# Patient Record
Sex: Female | Born: 1985
Health system: Southern US, Community
[De-identification: ages and names within clinical notes are randomized; demographics above are authoritative.]

## PROBLEM LIST (undated history)

## (undated) HISTORY — PX: FRACTURE SURGERY: SHX138

## (undated) HISTORY — PX: TUBAL LIGATION: SHX77

---

## 2016-11-16 ENCOUNTER — Emergency Department (HOSPITAL_COMMUNITY)
Admission: EM | Admit: 2016-11-16 | Discharge: 2016-11-16 | Disposition: A | Discharge status: Home / Self Care | Payer: Medicaid Other | Attending: Emergency Medicine | Admitting: Emergency Medicine

## 2016-11-16 ENCOUNTER — Encounter (HOSPITAL_COMMUNITY): Payer: Self-pay | Admitting: Emergency Medicine

## 2016-11-16 DIAGNOSIS — Y939 Activity, unspecified: Secondary | ICD-10-CM | POA: Diagnosis not present

## 2016-11-16 DIAGNOSIS — F17210 Nicotine dependence, cigarettes, uncomplicated: Secondary | ICD-10-CM | POA: Insufficient documentation

## 2016-11-16 DIAGNOSIS — S40861A Insect bite (nonvenomous) of right upper arm, initial encounter: Secondary | ICD-10-CM | POA: Insufficient documentation

## 2016-11-16 DIAGNOSIS — Y929 Unspecified place or not applicable: Secondary | ICD-10-CM | POA: Insufficient documentation

## 2016-11-16 DIAGNOSIS — W57XXXA Bitten or stung by nonvenomous insect and other nonvenomous arthropods, initial encounter: Secondary | ICD-10-CM | POA: Insufficient documentation

## 2016-11-16 DIAGNOSIS — Y999 Unspecified external cause status: Secondary | ICD-10-CM | POA: Diagnosis not present

## 2016-11-16 DIAGNOSIS — L03113 Cellulitis of right upper limb: Secondary | ICD-10-CM | POA: Diagnosis not present

## 2016-11-16 MED ORDER — DIPHENHYDRAMINE HCL 25 MG PO CAPS
50.0000 mg | ORAL_CAPSULE | Freq: Once | ORAL | Status: AC
  Administered 2016-11-16: 50 mg via ORAL
  Filled 2016-11-16: qty 2

## 2016-11-16 MED ORDER — SULFAMETHOXAZOLE-TRIMETHOPRIM 800-160 MG PO TABS: 1.0000 | ORAL_TABLET | Freq: Two times a day (BID) | ORAL | 0 refills | Status: AC

## 2016-11-16 MED ORDER — IBUPROFEN 600 MG PO TABS: 600.0000 mg | ORAL_TABLET | Freq: Four times a day (QID) | ORAL | 0 refills | Status: DC | PRN

## 2016-11-16 NOTE — ED Triage Notes (Signed)
Patient noticed small bump posterior lateral right upper arm yesterday then this morning when she woke up noticed erythema, pain , heat and swelling to upper arm. Patient bit by something just unsure what it was.

## 2016-11-16 NOTE — ED Provider Notes (Signed)
[facility]ENC[facility] Provider Note   CSN: [id number] Arrival date & time: 11/16/16  1015     History   Chief Complaint Chief Complaint  Patient presents with  . Insect Bite    HPI Danielle Osborn is a 31 y.o. female presenting with pruritic, painful, swollen, erythematous, warm patch to the posterior right upper extremity. She explains that she felt an insect bite yesterday morning which created a small bump in that area and was pruritic. She didn't think too much of it initially but then it continued to grow and get larger and larger, warm, painful with motion. She reports that she was unable to see the area as it is behind her arm but someone slide and sent her from work to get evaluated. She denies any systemic symptoms, no fever, chills, nausea, vomiting. She is otherwise well. She reports that it may be more painful now than pruritic, but she has been scratching at all day. Denies facial swelling, difficulty breathing, chest pain or other symptoms.  HPI  History reviewed. No pertinent past medical history.  There are no active problems to display for this patient.   Past Surgical History:  Procedure Laterality Date  . FRACTURE SURGERY     right leg fracture   . TUBAL LIGATION      OB History    No data available       Home Medications    Prior to Admission medications   Medication Sig Start Date End Date Taking? Authorizing Provider  ibuprofen (ADVIL,MOTRIN) 600 MG tablet Take 1 tablet (600 mg total) by mouth every 6 (six) hours as needed. 11/16/16   Mathews Robinsons B, PA-C  sulfamethoxazole-trimethoprim (BACTRIM DS,SEPTRA DS) 800-160 MG tablet Take 1 tablet by mouth 2 (two) times daily. 11/16/16 11/23/16  Georgiana Shore, PA-C    Family History No family history on file.  Social History Social History  Substance Use Topics  . Smoking status: Current Every Day Smoker    Types: Cigarettes  . Smokeless tobacco: Never Used  . Alcohol use No      Allergies   Patient has no allergy information on record.   Review of Systems Review of Systems  Constitutional: Negative for chills, fatigue and fever.  HENT: Negative for facial swelling and trouble swallowing.   Eyes: Negative for pain, redness, itching and visual disturbance.  Respiratory: Negative for cough, choking, chest tightness, shortness of breath, wheezing and stridor.   Cardiovascular: Negative for chest pain and palpitations.  Gastrointestinal: Negative for abdominal pain, nausea and vomiting.  Genitourinary: Negative for dysuria and hematuria.  Musculoskeletal: Positive for myalgias. Negative for arthralgias, back pain, gait problem, joint swelling, neck pain and neck stiffness.  Skin: Positive for color change and wound. Negative for pallor and rash.  Neurological: Negative for dizziness, tremors, seizures, syncope, facial asymmetry, speech difficulty, weakness, light-headedness, numbness and headaches.     Physical Exam Updated Vital Signs BP (!) 134/94 (BP Location: Left Arm)   Pulse 60   Temp 98.7 F (37.1 C) (Oral)   Resp 16   Ht 5' (1.524 m)   Wt 104.3 kg (230 lb)   LMP 10/23/2016   SpO2 100%   BMI 44.92 kg/m   Physical Exam  Constitutional: She appears well-developed and well-nourished. No distress.  Afebrile, nontoxic-appearing, sitting comfortable in chair in no acute distress.  HENT:  Head: Normocephalic and atraumatic.  Eyes: Conjunctivae and EOM are normal.  Neck: Normal range of motion. Neck supple.  Cardiovascular: Normal rate, regular  rhythm, normal heart sounds and intact distal pulses.   No murmur heard. Pulmonary/Chest: Effort normal and breath sounds normal. No respiratory distress. She has no wheezes. She exhibits no tenderness.  Abdominal: She exhibits no distension.  Musculoskeletal: Normal range of motion. She exhibits edema and tenderness. She exhibits no deformity.  Neurological: She is alert. No sensory deficit. She  exhibits normal muscle tone.  Skin: Skin is warm and dry. No rash noted. She is not diaphoretic. There is erythema. No pallor.  10 x 10 cm edematous, erythematous, warm patch with ill-defined borders to the right posterior upper arm midway between the elbow and shoulder. Central area of induration, tender to palpation.  Psychiatric: She has a normal mood and affect.  Nursing note and vitals reviewed.    ED Treatments / Results  Labs (all labs ordered are listed, but only abnormal results are displayed) Labs Reviewed - No data to display  EKG  EKG Interpretation None       Radiology No results found.  Procedures Procedures (including critical care time) EMERGENCY DEPARTMENT US SOFT TISSUE INTERPRETATION "Study: Limited Soft Tissue Ultrasound"  INDICATIONS: Soft tissue infection Multiple views of the body part were obtained in real-time with a multi-frequency linear probe  PERFORMED BY: Myself IMAGES ARCHIVED?: Yes SIDE:Right  BODY PART:Upper extremity INTERPRETATION:  No abcess noted and Cellulitis present   Medications Ordered in ED Medications  diphenhydrAMINE (BENADRYL) capsule 50 mg (50 mg Oral Given 11/16/16 1353)     Initial Impression / Assessment and Plan / ED Course  I have reviewed the triage vital signs and the nursing notes.  Pertinent labs & imaging results that were available during my care of the patient were reviewed by me and considered in my medical decision making (see chart for details).     Patient presenting with insect bite which has been pruritic but is now painful with movement. Erythema, warmth to touch and induration. Non-circumferential.  Raising concern for cellulitis. Ultrasound with evidence of early cellulitis. The area was marked with a surgical pen.  Patient discharged with antibiotics and close follow-up with PCP in 48 hours for recheck. Patient instructed on monitoring for any worsening of condition.  She was otherwise  well-appearing, nontoxic afebrile. She improved while in the emergency department.  Discussed strict return precautions and advised to return to the emergency department if experiencing any new or worsening symptoms. Instructions were understood and patient agreed with discharge plan. Final Clinical Impressions(s) / ED Diagnoses   Final diagnoses:  Insect bite, initial encounter  Cellulitis of right upper extremity    New Prescriptions Discharge Medication List as of 11/16/2016  3:04 PM    START taking these medications   Details  ibuprofen (ADVIL,MOTRIN) 600 MG tablet Take 1 tablet (600 mg total) by mouth every 6 (six) hours as needed., Starting Tue 11/16/2016, Print    sulfamethoxazole-trimethoprim (BACTRIM DS,SEPTRA DS) 800-160 MG tablet Take 1 tablet by mouth 2 (two) times daily., Starting Tue 11/16/2016, Until Tue 11/23/2016, Print         Georgiana Shore, PA-C 11/17/16 8299    Rolan Bucco, MD 11/19/16 [phone]

## 2016-11-16 NOTE — Discharge Instructions (Signed)
As discussed, please take entire course of antibiotics even if you improve. Drink plenty of fluids and stay well-hydrated keeping your urine clear. Ibuprofen for pain as needed. Have it rechecked by her primary care provider in 48 hours for improvement. Monitor the area marked for any worsening. Return to be seen if you experience fever, chills, nausea, vomiting, or if the area becomes larger, more painful or any other new concerning symptoms in the meantime.

## 2016-11-17 ENCOUNTER — Ambulatory Visit (HOSPITAL_COMMUNITY)
Admission: EM | Admit: 2016-11-17 | Discharge: 2016-11-17 | Disposition: A | Discharge status: Home / Self Care | Payer: Medicaid Other | Attending: Family Medicine | Admitting: Family Medicine

## 2016-11-17 ENCOUNTER — Encounter (HOSPITAL_COMMUNITY): Payer: Self-pay | Admitting: Emergency Medicine

## 2016-11-17 DIAGNOSIS — R21 Rash and other nonspecific skin eruption: Secondary | ICD-10-CM

## 2016-11-17 DIAGNOSIS — W57XXXA Bitten or stung by nonvenomous insect and other nonvenomous arthropods, initial encounter: Secondary | ICD-10-CM | POA: Diagnosis not present

## 2016-11-17 MED ORDER — METHYLPREDNISOLONE 4 MG PO TBPK: ORAL_TABLET | ORAL | 0 refills | Status: DC

## 2016-11-17 MED ORDER — HYDROXYZINE HCL 25 MG PO TABS: 25.0000 mg | ORAL_TABLET | Freq: Four times a day (QID) | ORAL | 0 refills | Status: DC

## 2016-11-17 NOTE — ED Provider Notes (Signed)
  [facility]   [id number] 11/17/16 Arrival Time: 1439  ASSESSMENT & PLAN:  1. Insect bite, initial encounter   2. Rash     Meds ordered this encounter  Medications  . methylPREDNISolone (MEDROL DOSEPAK) 4 MG TBPK tablet    Sig: Take 6-5-4-3-2-1 po qd    Dispense:  21 tablet    Refill:  0    Order Specific Question:   Supervising Provider    AnswerMardella Layman [[id number]]  . hydrOXYzine (ATARAX/VISTARIL) 25 MG tablet    Sig: Take 1 tablet (25 mg total) by mouth every 6 (six) hours.    Dispense:  12 tablet    Refill:  0    Order Specific Question:   Supervising Provider    Answer:   Mardella Layman [[id number]]    Reviewed expectations re: course of current medical issues. Questions answered. Outlined signs and symptoms indicating need for more acute intervention. Patient verbalized understanding. After Visit Summary given.   SUBJECTIVE:  Danielle Osborn is a 31 y.o. female who presents with complaint of rash on her abdomen, back, arms for 2 days  ROS: As per HPI.   OBJECTIVE:  Vitals:   11/17/16 1521  BP: 124/79  Pulse: 79  Temp: 98.8 F (37.1 C)  TempSrc: Oral  SpO2: 100%     General appearance: alert; no distress Eyes: PERRLA; EOMI; conjunctiva normal HENT: normocephalic; atraumatic; TMs normal; nasal mucosa normal; oral mucosa normal Neck: supple Lungs: clear to auscultation bilaterally Heart: regular rate and rhythm Skin: Raised erythematous rash on abdomen, arms, and back Neurologic: normal gait; normal symmetric reflexes Psychological: alert and cooperative; normal mood and affect  History reviewed. No pertinent past medical history.   has no past medical history on file.  No results found for this or any previous visit.  Labs Reviewed - No data to display  Imaging: No results found.  No Known Allergies  History reviewed. No pertinent family history. Past Surgical History:  Procedure Laterality Date  . FRACTURE SURGERY     right leg fracture   . TUBAL LIGATION           Deatra Canter, [place] 11/17/16 1736

## 2016-11-17 NOTE — ED Triage Notes (Signed)
Pt was treated yesterday for cellulitis of her right upper arm with Bactrim & Motrin.  The swelling in the affected arm has gone down, but she now states she has two new bumps, one on her right forearm and one on her middle, right back , that are itchy.

## 2017-03-13 ENCOUNTER — Other Ambulatory Visit: Payer: Self-pay

## 2017-03-13 ENCOUNTER — Emergency Department (HOSPITAL_COMMUNITY)
Admission: EM | Admit: 2017-03-13 | Discharge: 2017-03-13 | Disposition: A | Discharge status: Home / Self Care | Payer: Medicaid Other | Attending: Emergency Medicine | Admitting: Emergency Medicine

## 2017-03-13 ENCOUNTER — Encounter (HOSPITAL_COMMUNITY): Payer: Self-pay | Admitting: *Deleted

## 2017-03-13 DIAGNOSIS — F17210 Nicotine dependence, cigarettes, uncomplicated: Secondary | ICD-10-CM | POA: Diagnosis not present

## 2017-03-13 DIAGNOSIS — K0889 Other specified disorders of teeth and supporting structures: Secondary | ICD-10-CM | POA: Diagnosis present

## 2017-03-13 DIAGNOSIS — K047 Periapical abscess without sinus: Secondary | ICD-10-CM | POA: Diagnosis not present

## 2017-03-13 MED ORDER — CLINDAMYCIN HCL 300 MG PO CAPS: 300.0000 mg | ORAL_CAPSULE | Freq: Four times a day (QID) | ORAL | 0 refills | Status: DC

## 2017-03-13 NOTE — ED Notes (Signed)
Patient verbalizes understanding of discharge instructions. Opportunity for questioning and answers were provided. 

## 2017-03-13 NOTE — Discharge Instructions (Signed)
Return immediately for any increased swelling, throat swelling, difficulty swallowing or breathing, fevers, or any other worsening symptoms.

## 2017-03-13 NOTE — ED Triage Notes (Signed)
Pt reports right side dental pain, moderate swelling noted to face. Reports swelling has increase down into jaw line and neck. Airway intact.

## 2017-03-13 NOTE — ED Provider Notes (Signed)
[facility]   CSN: [id number] Arrival date & time: 03/13/17  1036     History   Chief Complaint Chief Complaint  Patient presents with  . Dental Pain  . Abscess    HPI Tristan Alan RipperHolloway is a 31 y.o. female.  Patient is a 31 year old female who presents with facial swelling.  She has had problems with a right upper back molar.  She is followed by a dentist.  Over the last 2 days she has had some increased facial swelling around the tooth.  She called the emergency line at her dentist on Friday which was 2 days ago and started on amoxicillin.  This is her second day of the amoxicillin.  She notes that the swelling has increased.  She denies any trouble swallowing.  She denies any neck swelling.  She denies any difficulty breathing.  She denies any fevers.  No eye swelling.  No nausea or vomiting.      History reviewed. No pertinent past medical history.  There are no active problems to display for this patient.   Past Surgical History:  Procedure Laterality Date  . FRACTURE SURGERY     right leg fracture   . TUBAL LIGATION      OB History    No data available       Home Medications    Prior to Admission medications   Medication Sig Start Date End Date Taking? Authorizing Provider  clindamycin (CLEOCIN) 300 MG capsule Take 1 capsule (300 mg total) by mouth 4 (four) times daily. X 7 days 03/13/17   Rolan BuccoBelfi, Kiah Vanalstine, MD  hydrOXYzine (ATARAX/VISTARIL) 25 MG tablet Take 1 tablet (25 mg total) by mouth every 6 (six) hours. 11/17/16   Deatra Canterxford, William J, FNP  ibuprofen (ADVIL,MOTRIN) 600 MG tablet Take 1 tablet (600 mg total) by mouth every 6 (six) hours as needed. 11/16/16   Mathews RobinsonsMitchell, Jessica B, PA-C  methylPREDNISolone (MEDROL DOSEPAK) 4 MG TBPK tablet Take 6-5-4-3-2-1 po qd 11/17/16   Deatra Canterxford, William J, FNP    Family History History reviewed. No pertinent family history.  Social History Social History   Tobacco Use    . Smoking status: Current Every Day Smoker    Types: Cigarettes  . Smokeless tobacco: Never Used  Substance Use Topics  . Alcohol use: No  . Drug use: Not on file     Allergies   Patient has no known allergies.   Review of Systems Review of Systems  Constitutional: Negative for fever.  HENT: Positive for dental problem and facial swelling. Negative for sore throat, trouble swallowing and voice change.   Eyes: Negative for redness.  Gastrointestinal: Negative for nausea and vomiting.  Musculoskeletal: Negative for neck pain.  Skin: Negative for wound.  Neurological: Negative for headaches.  All other systems reviewed and are negative.    Physical Exam Updated Vital Signs BP 111/63   Pulse 68   Temp 99 F (37.2 C) (Oral)   Resp 20   Ht 5' (1.524 m)   Wt 104.3 kg (230 lb)   LMP 02/28/2017   SpO2 93%   BMI 44.92 kg/m   Physical Exam  Constitutional: She is oriented to person, place, and time. She appears well-developed and well-nourished.  HENT:  Head: Normocephalic and atraumatic.  Patient has a decayed right upper back molar.  There is swelling around the tooth.  No fluctuant abscess is noted.  No trismus.  There is swelling overlying the right maxilla.  No other facial swelling is noted.  No neck swelling is noted.  Uvula is midline.  Eyes: Pupils are equal, round, and reactive to light.  Neck: Normal range of motion. Neck supple.  Cardiovascular: Normal rate, regular rhythm and normal heart sounds.  Pulmonary/Chest: Effort normal and breath sounds normal. No respiratory distress. She has no wheezes. She has no rales. She exhibits no tenderness.  Abdominal: Soft. Bowel sounds are normal. There is no tenderness. There is no rebound and no guarding.  Musculoskeletal: Normal range of motion. She exhibits no edema.  Lymphadenopathy:    She has no cervical adenopathy.  Neurological: She is alert and oriented to person, place, and time.  Skin: Skin is warm and dry. No  rash noted.  Psychiatric: She has a normal mood and affect.     ED Treatments / Results  Labs (all labs ordered are listed, but only abnormal results are displayed) Labs Reviewed - No data to display  EKG  EKG Interpretation None       Radiology No results found.  Procedures Procedures (including critical care time)  Medications Ordered in ED Medications - No data to display   Initial Impression / Assessment and Plan / ED Course  I have reviewed the triage vital signs and the nursing notes.  Pertinent labs & imaging results that were available during my care of the patient were reviewed by me and considered in my medical decision making (see chart for details).     Patient presents with facial swelling related to dental abscess.  She was advised to stop the amoxicillin and we will start clindamycin.  I advised her to follow-up with her dentist tomorrow and if she has any worsening swelling or cannot see her dentist tomorrow with worsening symptoms, she can return to the emergency department.  She was given strict return precautions.  Final Clinical Impressions(s) / ED Diagnoses   Final diagnoses:  Dental abscess    ED Discharge Orders        Ordered    clindamycin (CLEOCIN) 300 MG capsule  4 times daily     03/13/17 1235       Rolan BuccoBelfi, Kierah Goatley, MD 03/13/17 1244

## 2018-05-26 ENCOUNTER — Other Ambulatory Visit: Payer: Self-pay

## 2018-05-26 ENCOUNTER — Ambulatory Visit (HOSPITAL_COMMUNITY)
Admission: EM | Admit: 2018-05-26 | Discharge: 2018-05-26 | Disposition: A | Discharge status: Home / Self Care | Payer: Medicaid Other | Attending: Family Medicine | Admitting: Family Medicine

## 2018-05-26 DIAGNOSIS — K047 Periapical abscess without sinus: Secondary | ICD-10-CM

## 2018-05-26 DIAGNOSIS — K0889 Other specified disorders of teeth and supporting structures: Secondary | ICD-10-CM

## 2018-05-26 DIAGNOSIS — K029 Dental caries, unspecified: Secondary | ICD-10-CM

## 2018-05-26 DIAGNOSIS — S025XXA Fracture of tooth (traumatic), initial encounter for closed fracture: Secondary | ICD-10-CM

## 2018-05-26 MED ORDER — IBUPROFEN 800 MG PO TABS: 800.0000 mg | ORAL_TABLET | Freq: Three times a day (TID) | ORAL | 0 refills | Status: DC | PRN

## 2018-05-26 MED ORDER — PREDNISONE 20 MG PO TABS: 60.0000 mg | ORAL_TABLET | Freq: Every day | ORAL | 0 refills | Status: AC

## 2018-05-26 MED ORDER — CLINDAMYCIN HCL 300 MG PO CAPS: 300.0000 mg | ORAL_CAPSULE | Freq: Three times a day (TID) | ORAL | 0 refills | Status: AC

## 2018-05-26 MED ORDER — CLINDAMYCIN HCL 300 MG PO CAPS: 300.0000 mg | ORAL_CAPSULE | Freq: Three times a day (TID) | ORAL | 0 refills | Status: DC

## 2018-05-26 NOTE — ED Triage Notes (Signed)
Per pt she has a tooth on left upper jaw that has been giving her a problem. Some pressure in her sinus area. Pt does have some swelling under her eye that is noted. Pt says it is draining and she can taste it.

## 2018-05-26 NOTE — Discharge Instructions (Addendum)
Facial swelling worsens please follow-up immediately here in office.
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
## 2019-03-19 ENCOUNTER — Ambulatory Visit (HOSPITAL_COMMUNITY)
Admission: EM | Admit: 2019-03-19 | Discharge: 2019-03-19 | Disposition: A | Discharge status: Home / Self Care | Payer: Medicaid Other | Attending: Family Medicine | Admitting: Family Medicine

## 2019-03-19 ENCOUNTER — Other Ambulatory Visit: Payer: Self-pay

## 2019-03-19 ENCOUNTER — Encounter (HOSPITAL_COMMUNITY): Payer: Self-pay

## 2019-03-19 DIAGNOSIS — M25512 Pain in left shoulder: Secondary | ICD-10-CM | POA: Diagnosis not present

## 2019-03-19 DIAGNOSIS — M25559 Pain in unspecified hip: Secondary | ICD-10-CM

## 2019-03-19 DIAGNOSIS — M7918 Myalgia, other site: Secondary | ICD-10-CM | POA: Diagnosis not present

## 2019-03-19 DIAGNOSIS — G8911 Acute pain due to trauma: Secondary | ICD-10-CM

## 2019-03-19 MED ORDER — IBUPROFEN 800 MG PO TABS: 800.0000 mg | ORAL_TABLET | Freq: Three times a day (TID) | ORAL | 0 refills | Status: DC

## 2019-03-19 MED ORDER — TIZANIDINE HCL 4 MG PO TABS: 4.0000 mg | ORAL_TABLET | Freq: Four times a day (QID) | ORAL | 0 refills | Status: DC | PRN
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
## 2019-03-19 NOTE — Discharge Instructions (Signed)
Ice or heat to area Take ibuprofen 3 x a day with food Take muscle relaxer as needed Expect improvement in a few days See an orthopedic if not improving in a few days

## 2019-06-13 ENCOUNTER — Ambulatory Visit (HOSPITAL_COMMUNITY)
Admission: EM | Admit: 2019-06-13 | Discharge: 2019-06-13 | Disposition: A | Discharge status: Home / Self Care | Payer: Medicaid Other | Attending: Family Medicine | Admitting: Family Medicine

## 2019-06-13 ENCOUNTER — Encounter (HOSPITAL_COMMUNITY): Payer: Self-pay

## 2019-06-13 ENCOUNTER — Other Ambulatory Visit: Payer: Self-pay

## 2019-06-13 DIAGNOSIS — M25512 Pain in left shoulder: Secondary | ICD-10-CM

## 2019-06-13 MED ORDER — TRAMADOL HCL 50 MG PO TABS: 50.0000 mg | ORAL_TABLET | Freq: Four times a day (QID) | ORAL | 0 refills | Status: DC | PRN

## 2019-06-13 MED ORDER — PREDNISONE 10 MG (21) PO TBPK: ORAL_TABLET | Freq: Every day | ORAL | 0 refills | Status: DC
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
## 2019-06-13 NOTE — Discharge Instructions (Signed)
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
## 2019-12-22 ENCOUNTER — Emergency Department (HOSPITAL_COMMUNITY)
Admission: EM | Admit: 2019-12-22 | Discharge: 2019-12-23 | Disposition: A | Discharge status: Home / Self Care | Payer: Medicaid Other | Attending: Emergency Medicine | Admitting: Emergency Medicine

## 2019-12-22 DIAGNOSIS — Z5321 Procedure and treatment not carried out due to patient leaving prior to being seen by health care provider: Secondary | ICD-10-CM | POA: Diagnosis not present

## 2019-12-22 DIAGNOSIS — R109 Unspecified abdominal pain: Secondary | ICD-10-CM | POA: Diagnosis present

## 2019-12-22 LAB — COMPREHENSIVE METABOLIC PANEL
ALT: 19 U/L (ref 0–44)
AST: 19 U/L (ref 15–41)
Albumin: 3.5 g/dL (ref 3.5–5.0)
Alkaline Phosphatase: 51 U/L (ref 38–126)
Anion gap: 9 (ref 5–15)
BUN: 9 mg/dL (ref 6–20)
CO2: 27 mmol/L (ref 22–32)
Calcium: 8.9 mg/dL (ref 8.9–10.3)
Chloride: 103 mmol/L (ref 98–111)
Creatinine, Ser: 0.78 mg/dL (ref 0.44–1.00)
GFR calc Af Amer: >60 mL/min (ref >60)
GFR calc non Af Amer: >60 mL/min (ref >60)
Glucose, Bld: 85 mg/dL (ref 70–99)
Potassium: 3.7 mmol/L (ref 3.5–5.1)
Sodium: 139 mmol/L (ref 135–145)
Total Bilirubin: 0.4 mg/dL (ref 0.3–1.2)
Total Protein: 7.1 g/dL (ref 6.5–8.1)

## 2019-12-22 LAB — URINALYSIS, ROUTINE W REFLEX MICROSCOPIC
Bacteria, UA: NONE SEEN
Bilirubin Urine: NEGATIVE
Glucose, UA: NEGATIVE mg/dL
Ketones, ur: NEGATIVE mg/dL
Leukocytes,Ua: NEGATIVE
Nitrite: NEGATIVE
Protein, ur: NEGATIVE mg/dL
Specific Gravity, Urine: 1.027 (ref 1.005–1.030)
pH: 6 (ref 5.0–8.0)

## 2019-12-22 LAB — CBC
HCT: 40.2 % (ref 36.0–46.0)
Hemoglobin: 13.1 g/dL (ref 12.0–15.0)
MCH: 32.4 pg (ref 26.0–34.0)
MCHC: 32.6 g/dL (ref 30.0–36.0)
MCV: 99.5 fL (ref 80.0–100.0)
Platelets: 296 10*3/uL (ref 150–400)
RBC: 4.04 MIL/uL (ref 3.87–5.11)
RDW: 12.5 % (ref 11.5–15.5)
WBC: 9.6 10*3/uL (ref 4.0–10.5)
nRBC: 0 % (ref 0.0–0.2)

## 2019-12-22 LAB — I-STAT BETA HCG BLOOD, ED (MC, WL, AP ONLY): I-stat hCG, quantitative: <5 m[IU]/mL (ref <5)

## 2019-12-22 LAB — LIPASE, BLOOD: Lipase: 30 U/L (ref 11–51)

## 2019-12-22 NOTE — ED Triage Notes (Signed)
Pt last period stopped 12-18-19, had brown spotting on 12-19-19 and on 12-20-19 pt has had unbearable abd cramping.  Took Tylenol last night without relief, pain interfered with sleep.   Denies N/V/D.

## 2019-12-22 NOTE — ED Notes (Signed)
Pt called 3x no response  

## 2019-12-23 NOTE — ED Notes (Signed)
Called pt for rooming and no answer

## 2021-02-01 ENCOUNTER — Emergency Department (HOSPITAL_COMMUNITY): Payer: 59

## 2021-02-01 ENCOUNTER — Emergency Department (HOSPITAL_COMMUNITY)
Admission: EM | Admit: 2021-02-01 | Discharge: 2021-02-01 | Disposition: A | Discharge status: Home / Self Care | Payer: 59 | Attending: Emergency Medicine | Admitting: Emergency Medicine

## 2021-02-01 ENCOUNTER — Encounter (HOSPITAL_COMMUNITY): Payer: Self-pay | Admitting: Emergency Medicine

## 2021-02-01 ENCOUNTER — Other Ambulatory Visit: Payer: Self-pay

## 2021-02-01 DIAGNOSIS — M25561 Pain in right knee: Secondary | ICD-10-CM | POA: Insufficient documentation

## 2021-02-01 DIAGNOSIS — F17210 Nicotine dependence, cigarettes, uncomplicated: Secondary | ICD-10-CM | POA: Diagnosis not present

## 2021-02-01 MED ORDER — KETOROLAC TROMETHAMINE 30 MG/ML IJ SOLN
30.0000 mg | Freq: Once | INTRAMUSCULAR | Status: AC
Start: 2021-02-01 — End: 2021-02-01
  Administered 2021-02-01: 30 mg via INTRAMUSCULAR
  Filled 2021-02-01: qty 1

## 2021-02-01 NOTE — ED Provider Notes (Signed)
Emergency Medicine Provider Triage Evaluation Note  Tiyona Desouza , a 35 y.o. female  was evaluated in triage.  Pt complains of right knee pain.  Happened acutely when she was lifting a heavy bag of dog food.  She heard a popping sound..  Review of Systems  Positive: Right knee pain Negative:   Physical Exam  BP 136/85 (BP Location: Left Arm)   Pulse 76   Temp 98 F (36.7 C) (Oral)   Resp 18   SpO2 100%  Gen:   Awake, no distress   Resp:  Normal effort  MSK:   Decreased ROM to right knee secondary to pain other:   DP PT 2+  Medical Decision Making  Medically screening exam initiated at 5:47 PM.  Appropriate orders placed.  Amandine Mcglade was informed that the remainder of the evaluation will be completed by another provider, this initial triage assessment does not replace that evaluation, and the importance of remaining in the ED until their evaluation is complete.  Radiograph   Theron Arista, PA-C 02/01/21 1748    Derwood Kaplan, MD 02/01/21 1944

## 2021-02-01 NOTE — Progress Notes (Signed)
Orthopedic Tech Progress Note Patient Details:  Danielle Osborn 20-Feb-1986 [id number]  Ortho Devices Type of Ortho Device: Knee Immobilizer Ortho Device/Splint Location: right Ortho Device/Splint Interventions: Application   Post Interventions Patient Tolerated: Well Instructions Provided: Care of device  Saul Fordyce 02/01/2021, 6:50 PM

## 2021-02-01 NOTE — ED Provider Notes (Signed)
[facility] Provider Note   CSN: [id number] Arrival date & time: 02/01/21  1639     History Chief Complaint  Patient presents with   Knee Pain    Danielle Osborn is a 35 y.o. female.  HPI  Patient presents with right knee pain.  This happened acutely when she was lifting a bag of 50 pound dog food.  She fell and she heard a crackling sound.  She denies any numbness or tingling in the limb, not noticing any swelling or pain in her calf.  The pain is worse when she is walking, it is constant.  She has tried some Tylenol as well as an ill fitting knee brace, either these have really helped.  No history of prior surgeries on the knee.  No history of gout, erythematous or swelling.  History reviewed. No pertinent past medical history.  There are no problems to display for this patient.   Past Surgical History:  Procedure Laterality Date   FRACTURE SURGERY     right leg fracture    TUBAL LIGATION       OB History   No obstetric history on file.     No family history on file.  Social History   Tobacco Use   Smoking status: Every Day    Types: Cigarettes   Smokeless tobacco: Never   Tobacco comments:    2 cigs a day  Vaping Use   Vaping Use: Never used  Substance Use Topics   Alcohol use: No   Drug use: Never    Home Medications Prior to Admission medications   Medication Sig Start Date End Date Taking? Authorizing Provider  gabapentin (NEURONTIN) 100 MG capsule Take 100 mg by mouth daily. 04/12/19   [provider]  ibuprofen (ADVIL) 800 MG tablet Take 1 tablet (800 mg total) by mouth 3 (three) times daily. 03/19/19   Eustace Moore, MD  meloxicam (MOBIC) 7.5 MG tablet Take 7.5 mg by mouth daily. 04/12/19   [provider]  predniSONE (STERAPRED UNI-PAK 21 TAB) 10 MG (21) TBPK tablet Take by mouth daily. Take as directed. 06/13/19   Mardella Layman, MD  traMADol (ULTRAM) 50 MG tablet Take 1 tablet (50 mg  total) by mouth every 6 (six) hours as needed. 06/13/19   Mardella Layman, MD    Allergies    Patient has no known allergies.  Review of Systems   Review of Systems  Constitutional:  Negative for fever.  Musculoskeletal:  Positive for gait problem and myalgias.   Physical Exam Updated Vital Signs BP 136/85 (BP Location: Left Arm)   Pulse 76   Temp 98 F (36.7 C) (Oral)   Resp 18   LMP 02/01/2021   SpO2 100%   Physical Exam Vitals and nursing note reviewed. Exam conducted with a chaperone present.  Constitutional:      General: She is not in acute distress.    Appearance: Normal appearance.  HENT:     Head: Normocephalic and atraumatic.  Eyes:     General: No scleral icterus.    Extraocular Movements: Extraocular movements intact.     Pupils: Pupils are equal, round, and reactive to light.  Cardiovascular:     Pulses: Normal pulses.     Comments: DP, PT, radial pulse 2+ bilaterally Musculoskeletal:        General: Tenderness present.     Comments: Right knee pain, no swelling.  She does have decreased range of motion secondary to the  pain.  No calf tenderness.  Skin:    Coloration: Skin is not jaundiced.  Neurological:     Mental Status: She is alert. Mental status is at baseline.     Coordination: Coordination normal.     Comments: Gait somewhat awkward secondary to pain.    ED Results / Procedures / Treatments   Labs (all labs ordered are listed, but only abnormal results are displayed) Labs Reviewed - No data to display  EKG None  Radiology DG Knee Complete 4 Views Right  Result Date: 02/01/2021 CLINICAL DATA:  Knee pain EXAM: RIGHT KNEE - COMPLETE 4+ VIEW COMPARISON:  None. FINDINGS: No evidence of fracture, dislocation, or joint effusion. No evidence of arthropathy or other focal bone abnormality. Soft tissues are unremarkable. IMPRESSION: Negative. Electronically Signed   By: Marlan Palau M.D.   On: 02/01/2021 18:23    Procedures Procedures    Medications Ordered in ED Medications - No data to display  ED Course  I have reviewed the triage vital signs and the nursing notes.  Pertinent labs & imaging results that were available during my care of the patient were reviewed by me and considered in my medical decision making (see chart for details).    MDM Rules/Calculators/A&P                           Vitals are stable.  He is ambulatory with normal gait.  Radiograph ordered, negative for any fracture dislocation.  Doubt gout or septic joint and she is able to tolerate range of motion.  No no erythema or significant swelling.  Abdominal tear is possible, but she is able to bear weight without significant problem.  We will give immobilizer, anti-inflammatory and have her follow-up with orthopedic outpatient.  Patient discharged in stable condition.  Final Clinical Impression(s) / ED Diagnoses Final diagnoses:  None    Rx / DC Orders ED Discharge Orders     None        Theron Arista, [place] 02/01/21 1903    Derwood Kaplan, MD 02/01/21 1944

## 2021-02-01 NOTE — ED Triage Notes (Signed)
Patient here from home reporting right knee pain that started Friday. States that he bent over to pick up a 50lbs dog food bag "and heard a pop". Brace to knee in which she reports that she already had.

## 2021-02-06 DIAGNOSIS — M25561 Pain in right knee: Secondary | ICD-10-CM | POA: Diagnosis not present

## 2021-02-08 ENCOUNTER — Encounter (HOSPITAL_COMMUNITY): Payer: Self-pay | Admitting: Emergency Medicine

## 2021-02-08 ENCOUNTER — Emergency Department (HOSPITAL_COMMUNITY)
Admission: EM | Admit: 2021-02-08 | Discharge: 2021-02-08 | Disposition: A | Discharge status: Home / Self Care | Payer: 59 | Attending: Emergency Medicine | Admitting: Emergency Medicine

## 2021-02-08 DIAGNOSIS — F17210 Nicotine dependence, cigarettes, uncomplicated: Secondary | ICD-10-CM | POA: Insufficient documentation

## 2021-02-08 DIAGNOSIS — Z20822 Contact with and (suspected) exposure to covid-19: Secondary | ICD-10-CM | POA: Insufficient documentation

## 2021-02-08 DIAGNOSIS — J029 Acute pharyngitis, unspecified: Secondary | ICD-10-CM | POA: Insufficient documentation

## 2021-02-08 LAB — RESP PANEL BY RT-PCR (FLU A&B, COVID) ARPGX2
Influenza A by PCR: NEGATIVE
Influenza B by PCR: NEGATIVE
SARS Coronavirus 2 by RT PCR: NEGATIVE

## 2021-02-08 LAB — GROUP A STREP BY PCR: Group A Strep by PCR: NOT DETECTED

## 2021-02-08 MED ORDER — LIDOCAINE VISCOUS HCL 2 % MT SOLN: 15.0000 mL | OROMUCOSAL | 0 refills | Status: DC | PRN

## 2021-02-08 MED ORDER — ACETAMINOPHEN 500 MG PO TABS: 500.0000 mg | ORAL_TABLET | Freq: Four times a day (QID) | ORAL | 0 refills | Status: DC | PRN

## 2021-02-08 MED ORDER — LIDOCAINE VISCOUS HCL 2 % MT SOLN
15.0000 mL | Freq: Once | OROMUCOSAL | Status: AC
  Administered 2021-02-08: 15 mL via OROMUCOSAL
  Filled 2021-02-08: qty 15

## 2021-02-08 NOTE — ED Triage Notes (Addendum)
PT c/o neck pain and sore throat with generalized swelling since yesterday. States pain when swallowing and with ROM. Speaking in full sentences. Denies exposure to allergens.

## 2021-02-08 NOTE — Discharge Instructions (Signed)
You have been evaluated for your symptoms.  Your strep test as well as flu and COVID-19 test came back negative.  You may take Tylenol or ibuprofen as needed for pain.  You may continue using viscous lidocaine and as needed for sore throat.  If your symptoms persist, consider follow-up with ear nose and throat specialist for further evaluation.

## 2021-02-08 NOTE — ED Provider Notes (Signed)
Emergency Medicine Provider Triage Evaluation Note  Danielle Osborn , a 35 y.o. female  was evaluated in triage.  Pt complains of sore throat.  Review of Systems  Positive: Sore throat Negative: Fever, chills, sob, cough, congestion, bodyaches  Physical Exam  BP 126/89 (BP Location: Left Arm)   Pulse 88   Temp 98.9 F (37.2 C) (Oral)   Resp 16   Ht 5\' 2"  (1.575 m)   Wt 104.3 kg   LMP 02/01/2021   SpO2 97%   BMI 42.07 kg/m  Gen:   Awake, no distress   Resp:  Normal effort  MSK:   Moves extremities without difficulty  Other:    Medical Decision Making  Medically screening exam initiated at 1:21 PM.  Appropriate orders placed.  Cloma Lashway was informed that the remainder of the evaluation will be completed by another provider, this initial triage assessment does not replace that evaluation, and the importance of remaining in the ED until their evaluation is complete.  Report sore throat x 2 days.  No other cold sxs.  Throat exam unremarkable, normal phonation.    Alan Ripper, PA-C 02/08/21 1321    [place], 02/10/21, MD 02/08/21 1420

## 2021-02-08 NOTE — ED Provider Notes (Signed)
[facility]-EMERGENCY DEPT Provider Note   CSN: [id number] Arrival date & time: 02/08/21  1302     History Chief Complaint  Patient presents with   Neck Pain   Sore Throat    Danielle Osborn is a 34 y.o. female.  The history is provided by the patient. No language interpreter was used.  Neck Pain Sore Throat   35 year old female who presents for evaluation of sore throat.  Patient report for the past 2 days she feels a sensation of throat irritation and fullness that is persistent and moderate in severity more noticeable with swallowing and with talking.  No fever runny nose sneezing coughing chest pain shortness of breath abdominal pain nausea vomiting or diarrhea.  No recent sick contact.  No specific treatment tried.  History reviewed. No pertinent past medical history.  There are no problems to display for this patient.   Past Surgical History:  Procedure Laterality Date   FRACTURE SURGERY     right leg fracture    TUBAL LIGATION       OB History   No obstetric history on file.     No family history on file.  Social History   Tobacco Use   Smoking status: Every Day    Types: Cigarettes   Smokeless tobacco: Never   Tobacco comments:    2 cigs a day  Vaping Use   Vaping Use: Never used  Substance Use Topics   Alcohol use: No   Drug use: Never    Home Medications Prior to Admission medications   Medication Sig Start Date End Date Taking? Authorizing Provider  gabapentin (NEURONTIN) 100 MG capsule Take 100 mg by mouth daily. 04/12/19   [provider]  ibuprofen (ADVIL) 800 MG tablet Take 1 tablet (800 mg total) by mouth 3 (three) times daily. 03/19/19   Eustace Moore, MD  meloxicam (MOBIC) 7.5 MG tablet Take 7.5 mg by mouth daily. 04/12/19   [provider]  predniSONE (STERAPRED UNI-PAK 21 TAB) 10 MG (21) TBPK tablet Take by mouth daily. Take as directed. 06/13/19   Mardella Layman, MD  traMADol (ULTRAM) 50 MG  tablet Take 1 tablet (50 mg total) by mouth every 6 (six) hours as needed. 06/13/19   Mardella Layman, MD    Allergies    Patient has no known allergies.  Review of Systems   Review of Systems  Musculoskeletal:  Positive for neck pain.  All other systems reviewed and are negative.  Physical Exam Updated Vital Signs BP 126/89 (BP Location: Left Arm)   Pulse 88   Temp 98.9 F (37.2 C) (Oral)   Resp 16   Ht 5\' 2"  (1.575 m)   Wt 104.3 kg   LMP 02/01/2021   SpO2 97%   BMI 42.07 kg/m   Physical Exam Vitals and nursing note reviewed.  Constitutional:      General: She is not in acute distress.    Appearance: She is well-developed.  HENT:     Head: Atraumatic.     Right Ear: Tympanic membrane normal.     Left Ear: Tympanic membrane normal.     Nose: No congestion or rhinorrhea.     Mouth/Throat:     Mouth: Mucous membranes are moist.     Pharynx: Oropharynx is clear. Uvula midline. No oropharyngeal exudate, posterior oropharyngeal erythema or uvula swelling.     Tonsils: No tonsillar exudate or tonsillar abscesses.  Eyes:     Conjunctiva/sclera: Conjunctivae normal.  Cardiovascular:  Rate and Rhythm: Normal rate and regular rhythm.  Pulmonary:     Effort: Pulmonary effort is normal.  Abdominal:     Palpations: Abdomen is soft.  Musculoskeletal:     Cervical back: Neck supple.  Skin:    Findings: No rash.  Neurological:     Mental Status: She is alert.  Psychiatric:        Mood and Affect: Mood normal.    ED Results / Procedures / Treatments   Labs (all labs ordered are listed, but only abnormal results are displayed) Labs Reviewed  GROUP A STREP BY PCR  RESP PANEL BY RT-PCR (FLU A&B, COVID) [id number]    EKG None  Radiology No results found.  Procedures Procedures   Medications Ordered in ED Medications  lidocaine (XYLOCAINE) 2 % viscous mouth solution 15 mL (15 mLs Mouth/Throat Given 02/08/21 1346)    ED Course  I have reviewed the triage vital  signs and the nursing notes.  Pertinent labs & imaging results that were available during my care of the patient were reviewed by me and considered in my medical decision making (see chart for details).    MDM Rules/Calculators/A&P                           BP 126/89 (BP Location: Left Arm)   Pulse 88   Temp 98.9 F (37.2 C) (Oral)   Resp 16   Ht 5\' 2"  (1.575 m)   Wt 104.3 kg   LMP 02/01/2021   SpO2 97%   BMI 42.07 kg/m   Final Clinical Impression(s) / ED Diagnoses Final diagnoses:  Sore throat    Rx / DC Orders ED Discharge Orders          Ordered    acetaminophen (TYLENOL) 500 MG tablet  Every 6 hours PRN        02/08/21 1522    lidocaine (XYLOCAINE) 2 % solution  As needed        02/08/21 1522           1:38 PM Patient endorsed sore throat X 2 days.  Throat exam unremarkable low suspicion for deep tissue infection.  Strep test as well as viral respiratory panel ordered.  Viscous lidocaine given.  3:20 PM Strep test as well as viral respiratory panel without any acute finding.  As mentioned earlier patient has normal phonation, no difficulty swallowing, overall well-appearing, vital signs stable.  I suspect this is still likely to be a viral etiology and likely resolved within a week span however I will give referral to ENT for follow-up if symptoms persist, return precaution discussed.  Patient otherwise stable for discharge. Doubt thyroiditis.   02/10/21, PA-C 02/08/21 1523    Tegeler, 02/10/21, MD 02/08/21 [phone]

## 2021-03-06 DIAGNOSIS — Z114 Encounter for screening for human immunodeficiency virus [HIV]: Secondary | ICD-10-CM | POA: Diagnosis not present

## 2021-03-06 DIAGNOSIS — Z202 Contact with and (suspected) exposure to infections with a predominantly sexual mode of transmission: Secondary | ICD-10-CM | POA: Diagnosis not present

## 2021-03-06 DIAGNOSIS — Z113 Encounter for screening for infections with a predominantly sexual mode of transmission: Secondary | ICD-10-CM | POA: Diagnosis not present

## 2021-03-11 ENCOUNTER — Encounter: Payer: Medicaid Other | Admitting: Obstetrics

## 2021-06-01 ENCOUNTER — Emergency Department (HOSPITAL_COMMUNITY): Payer: 59

## 2021-06-01 ENCOUNTER — Emergency Department (HOSPITAL_COMMUNITY)
Admission: EM | Admit: 2021-06-01 | Discharge: 2021-06-01 | Disposition: A | Discharge status: Home / Self Care | Payer: 59 | Attending: Emergency Medicine | Admitting: Emergency Medicine

## 2021-06-01 DIAGNOSIS — M25552 Pain in left hip: Secondary | ICD-10-CM | POA: Diagnosis not present

## 2021-06-01 DIAGNOSIS — Y92410 Unspecified street and highway as the place of occurrence of the external cause: Secondary | ICD-10-CM | POA: Insufficient documentation

## 2021-06-01 MED ORDER — OXYCODONE-ACETAMINOPHEN 5-325 MG PO TABS
1.0000 | ORAL_TABLET | ORAL | Status: DC | PRN
  Administered 2021-06-01: 1 via ORAL
  Filled 2021-06-01: qty 1

## 2021-06-01 NOTE — ED Notes (Signed)
Pt verbalized understanding of d/c instructions, meds and followup care. Denies questions. VSS, no distress noted. Steady gait to exit with all belongings.  ?
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
## 2021-06-05 ENCOUNTER — Encounter (HOSPITAL_COMMUNITY): Payer: Self-pay

## 2021-06-05 ENCOUNTER — Ambulatory Visit (HOSPITAL_COMMUNITY)
Admission: EM | Admit: 2021-06-05 | Discharge: 2021-06-05 | Disposition: A | Discharge status: Home / Self Care | Payer: 59 | Attending: Family Medicine | Admitting: Family Medicine

## 2021-06-05 DIAGNOSIS — M5442 Lumbago with sciatica, left side: Secondary | ICD-10-CM

## 2021-06-05 DIAGNOSIS — M5441 Lumbago with sciatica, right side: Secondary | ICD-10-CM | POA: Diagnosis not present

## 2021-06-05 MED ORDER — METHOCARBAMOL 500 MG PO TABS: 500.0000 mg | ORAL_TABLET | Freq: Two times a day (BID) | ORAL | 0 refills | Status: DC

## 2021-06-05 MED ORDER — KETOROLAC TROMETHAMINE 60 MG/2ML IM SOLN
60.0000 mg | Freq: Once | INTRAMUSCULAR | Status: AC
Start: 2021-06-05 — End: 2021-06-05
  Administered 2021-06-05: 60 mg via INTRAMUSCULAR

## 2021-06-05 MED ORDER — KETOROLAC TROMETHAMINE 60 MG/2ML IM SOLN
INTRAMUSCULAR | Status: AC
  Filled 2021-06-05: qty 2

## 2021-06-05 MED ORDER — PREDNISONE 20 MG PO TABS: 40.0000 mg | ORAL_TABLET | Freq: Every day | ORAL | 0 refills | Status: DC
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
## 2021-08-27 ENCOUNTER — Other Ambulatory Visit: Payer: Self-pay

## 2021-08-27 ENCOUNTER — Encounter (HOSPITAL_COMMUNITY): Payer: Self-pay | Admitting: Emergency Medicine

## 2021-08-27 ENCOUNTER — Emergency Department (HOSPITAL_COMMUNITY)
Admission: EM | Admit: 2021-08-27 | Discharge: 2021-08-27 | Discharge status: Against Medical Advice | Payer: 59 | Attending: Emergency Medicine | Admitting: Emergency Medicine

## 2021-08-27 ENCOUNTER — Emergency Department (HOSPITAL_COMMUNITY): Payer: 59

## 2021-08-27 DIAGNOSIS — R0602 Shortness of breath: Secondary | ICD-10-CM | POA: Diagnosis not present

## 2021-08-27 DIAGNOSIS — R2241 Localized swelling, mass and lump, right lower limb: Secondary | ICD-10-CM | POA: Diagnosis not present

## 2021-08-27 DIAGNOSIS — R2240 Localized swelling, mass and lump, unspecified lower limb: Secondary | ICD-10-CM | POA: Diagnosis not present

## 2021-08-27 DIAGNOSIS — I509 Heart failure, unspecified: Secondary | ICD-10-CM | POA: Diagnosis not present

## 2021-08-27 DIAGNOSIS — Z5321 Procedure and treatment not carried out due to patient leaving prior to being seen by health care provider: Secondary | ICD-10-CM | POA: Insufficient documentation

## 2021-08-27 LAB — CBC WITH DIFFERENTIAL/PLATELET
Abs Immature Granulocytes: 0.03 10*3/uL (ref 0.00–0.07)
Basophils Absolute: 0 10*3/uL (ref 0.0–0.1)
Basophils Relative: 0 %
Eosinophils Absolute: 0.3 10*3/uL (ref 0.0–0.5)
Eosinophils Relative: 3 %
HCT: 40.5 % (ref 36.0–46.0)
Hemoglobin: 13.1 g/dL (ref 12.0–15.0)
Immature Granulocytes: 0 %
Lymphocytes Relative: 32 %
Lymphs Abs: 3.2 10*3/uL (ref 0.7–4.0)
MCH: 32.1 pg (ref 26.0–34.0)
MCHC: 32.3 g/dL (ref 30.0–36.0)
MCV: 99.3 fL (ref 80.0–100.0)
Monocytes Absolute: 0.9 10*3/uL (ref 0.1–1.0)
Monocytes Relative: 9 %
Neutro Abs: 5.7 10*3/uL (ref 1.7–7.7)
Neutrophils Relative %: 56 %
Platelets: 299 10*3/uL (ref 150–400)
RBC: 4.08 MIL/uL (ref 3.87–5.11)
RDW: 12.3 % (ref 11.5–15.5)
WBC: 10.2 10*3/uL (ref 4.0–10.5)
nRBC: 0 % (ref 0.0–0.2)

## 2021-08-27 LAB — BRAIN NATRIURETIC PEPTIDE: B Natriuretic Peptide: 20.9 pg/mL (ref 0.0–100.0)

## 2021-08-27 LAB — COMPREHENSIVE METABOLIC PANEL
ALT: 14 U/L (ref 0–44)
AST: 15 U/L (ref 15–41)
Albumin: 3.6 g/dL (ref 3.5–5.0)
Alkaline Phosphatase: 52 U/L (ref 38–126)
Anion gap: 6 (ref 5–15)
BUN: 13 mg/dL (ref 6–20)
CO2: 28 mmol/L (ref 22–32)
Calcium: 9.2 mg/dL (ref 8.9–10.3)
Chloride: 104 mmol/L (ref 98–111)
Creatinine, Ser: 0.66 mg/dL (ref 0.44–1.00)
GFR, Estimated: >60 mL/min (ref >60)
Glucose, Bld: 113 mg/dL — ABNORMAL HIGH (ref 70–99)
Potassium: 4.4 mmol/L (ref 3.5–5.1)
Sodium: 138 mmol/L (ref 135–145)
Total Bilirubin: 0.5 mg/dL (ref 0.3–1.2)
Total Protein: 7.6 g/dL (ref 6.5–8.1)

## 2021-08-27 MED ORDER — ALBUTEROL SULFATE HFA 108 (90 BASE) MCG/ACT IN AERS: 2.0000 | INHALATION_SPRAY | RESPIRATORY_TRACT | Status: DC | PRN
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
## 2021-09-08 ENCOUNTER — Encounter (HOSPITAL_COMMUNITY): Payer: Self-pay

## 2021-09-08 ENCOUNTER — Ambulatory Visit (HOSPITAL_COMMUNITY)
Admission: EM | Admit: 2021-09-08 | Discharge: 2021-09-08 | Disposition: A | Discharge status: Home / Self Care | Payer: 59 | Attending: Student | Admitting: Student

## 2021-09-08 DIAGNOSIS — M722 Plantar fascial fibromatosis: Secondary | ICD-10-CM | POA: Diagnosis not present

## 2021-09-08 DIAGNOSIS — R609 Edema, unspecified: Secondary | ICD-10-CM | POA: Insufficient documentation

## 2021-09-08 LAB — COMPREHENSIVE METABOLIC PANEL
ALT: 14 U/L (ref 0–44)
AST: 19 U/L (ref 15–41)
Albumin: 3.6 g/dL (ref 3.5–5.0)
Alkaline Phosphatase: 52 U/L (ref 38–126)
Anion gap: 6 (ref 5–15)
BUN: 9 mg/dL (ref 6–20)
CO2: 29 mmol/L (ref 22–32)
Calcium: 9.5 mg/dL (ref 8.9–10.3)
Chloride: 104 mmol/L (ref 98–111)
Creatinine, Ser: 0.63 mg/dL (ref 0.44–1.00)
GFR, Estimated: >60 mL/min (ref >60)
Glucose, Bld: 84 mg/dL (ref 70–99)
Potassium: 4.1 mmol/L (ref 3.5–5.1)
Sodium: 139 mmol/L (ref 135–145)
Total Bilirubin: 0.4 mg/dL (ref 0.3–1.2)
Total Protein: 7.3 g/dL (ref 6.5–8.1)

## 2021-09-08 LAB — CBC WITH DIFFERENTIAL/PLATELET
Abs Immature Granulocytes: 0.02 10*3/uL (ref 0.00–0.07)
Basophils Absolute: 0 10*3/uL (ref 0.0–0.1)
Basophils Relative: 1 %
Eosinophils Absolute: 0.2 10*3/uL (ref 0.0–0.5)
Eosinophils Relative: 2 %
HCT: 38.6 % (ref 36.0–46.0)
Hemoglobin: 12.6 g/dL (ref 12.0–15.0)
Immature Granulocytes: 0 %
Lymphocytes Relative: 38 %
Lymphs Abs: 3.1 10*3/uL (ref 0.7–4.0)
MCH: 32.1 pg (ref 26.0–34.0)
MCHC: 32.6 g/dL (ref 30.0–36.0)
MCV: 98.2 fL (ref 80.0–100.0)
Monocytes Absolute: 0.9 10*3/uL (ref 0.1–1.0)
Monocytes Relative: 11 %
Neutro Abs: 4 10*3/uL (ref 1.7–7.7)
Neutrophils Relative %: 48 %
Platelets: 347 10*3/uL (ref 150–400)
RBC: 3.93 MIL/uL (ref 3.87–5.11)
RDW: 12.4 % (ref 11.5–15.5)
WBC: 8.3 10*3/uL (ref 4.0–10.5)
nRBC: 0 % (ref 0.0–0.2)

## 2021-09-08 LAB — BRAIN NATRIURETIC PEPTIDE: B Natriuretic Peptide: 40.1 pg/mL (ref 0.0–100.0)

## 2021-09-08 MED ORDER — PREDNISONE 20 MG PO TABS: 40.0000 mg | ORAL_TABLET | Freq: Every day | ORAL | 0 refills | Status: AC

## 2021-09-08 NOTE — Discharge Instructions (Addendum)
-  Prednisone, 2 pills taken at the same time for 5 days in a row.  Try taking this earlier in the day as it can give you energy. Avoid NSAIDs like ibuprofen and alleve while taking this medication as they can increase your risk of stomach upset and even GI bleeding when in combination with a steroid. You can continue tylenol (acetaminophen) up to 1000mg  3x daily. -Continue compression socks while standing at work  -Elevate legs at the end of the day -Follow-up with orthopedist -We'll call with any abnormal lab results

## 2021-09-08 NOTE — ED Triage Notes (Signed)
Pt c/o swelling to bilateral lower leg and feet for 2wks. Denies taking any meds for pain or swelling, denies SOB.

## 2021-09-08 NOTE — ED Provider Notes (Signed)
[place]    CSN: [id number] Arrival date & time: 09/08/21  1718      History   Chief Complaint Chief Complaint  Patient presents with   Joint Swelling    HPI Danielle Osborn is a 36 y.o. female presenting with bilateral lower extremity edema for 2 weeks.  Distant history of right lower extremity fracture, patient states this was not set properly and does give her issues.  She states that her bilateral lower extremities from the ankles to the calves have been swollen and uncomfortable.  She does stand for 4 to 5 hours daily at work, but has been wearing compression hose for this.  She also notes that the bottoms of her feet have been very tender when she stands in the morning.  She has a family history of heart failure, and she is concerned for this, though she has no chest pain, shortness of breath, dizziness, weakness.  HPI  History reviewed. No pertinent past medical history.  There are no problems to display for this patient.   Past Surgical History:  Procedure Laterality Date   FRACTURE SURGERY     right leg fracture    TUBAL LIGATION      OB History   No obstetric history on file.      Home Medications    Prior to Admission medications   Medication Sig Start Date End Date Taking? Authorizing Provider  predniSONE (DELTASONE) 20 MG tablet Take 2 tablets (40 mg total) by mouth daily for 5 days. Take with breakfast or lunch. Avoid NSAIDs (ibuprofen, etc) while taking this medication. 09/08/21 09/13/21 Yes Hazel Sams, PA-C  acetaminophen (TYLENOL) 500 MG tablet Take 1 tablet (500 mg total) by mouth every 6 (six) hours as needed. 02/08/21   Domenic Moras, PA-C  gabapentin (NEURONTIN) 100 MG capsule Take 100 mg by mouth daily. 04/12/19   [provider]  ibuprofen (ADVIL) 800 MG tablet Take 1 tablet (800 mg total) by mouth 3 (three) times daily. 03/19/19   Raylene Everts, MD  lidocaine (XYLOCAINE) 2 % solution Use as directed 15 mLs in the  mouth or throat as needed for mouth pain. 02/08/21   Domenic Moras, PA-C  meloxicam (MOBIC) 7.5 MG tablet Take 7.5 mg by mouth daily. 04/12/19   [provider]  traMADol (ULTRAM) 50 MG tablet Take 1 tablet (50 mg total) by mouth every 6 (six) hours as needed. 06/13/19   Vanessa Kick, MD    Family History History reviewed. No pertinent family history.  Social History Social History   Tobacco Use   Smoking status: Every Day    Types: Cigarettes   Smokeless tobacco: Never   Tobacco comments:    2 cigs a day  Vaping Use   Vaping Use: Never used  Substance Use Topics   Alcohol use: No   Drug use: Never     Allergies   Patient has no known allergies.   Review of Systems Review of Systems  Musculoskeletal:        Bilateral lower extremity swelling   All other systems reviewed and are negative.    Physical Exam Triage Vital Signs ED Triage Vitals [09/08/21 1811]  Enc Vitals Group     BP (!) 142/92     Pulse Rate 64     Resp 18     Temp 98.3 F (36.8 C)     Temp Source Oral     SpO2 98 %     Weight  Height      Head Circumference      Peak Flow      Pain Score 8     Pain Loc      Pain Edu?      Excl. in [place]?    No data found.  Updated Vital Signs BP (!) 142/92 (BP Location: Left Arm)   Pulse 64   Temp 98.3 F (36.8 C) (Oral)   Resp 18   LMP 09/02/2021   SpO2 98%   Visual Acuity Right Eye Distance:   Left Eye Distance:   Bilateral Distance:    Right Eye Near:   Left Eye Near:    Bilateral Near:     Physical Exam Vitals reviewed.  Constitutional:      Appearance: Normal appearance. She is not diaphoretic.  HENT:     Head: Normocephalic and atraumatic.     Mouth/Throat:     Mouth: Mucous membranes are moist.  Eyes:     Extraocular Movements: Extraocular movements intact.     Pupils: Pupils are equal, round, and reactive to light.  Cardiovascular:     Rate and Rhythm: Normal rate and regular rhythm.     Pulses:          Radial  pulses are 2+ on the right side and 2+ on the left side.     Heart sounds: Normal heart sounds.     Comments: 1+ pitting edema extending from ankles to mid-calves. Calves are equal, symmetric, and nontender. DP palpable.  Pulmonary:     Effort: Pulmonary effort is normal.     Breath sounds: Normal breath sounds.  Abdominal:     Palpations: Abdomen is soft.     Tenderness: There is no abdominal tenderness. There is no guarding or rebound.  Musculoskeletal:     Right lower leg: 1+ Edema present.     Left lower leg: 1+ Edema present.     Comments: Bilateral feet- TTP plantar fascia. Pain elicited with standing.  Skin:    General: Skin is warm.     Capillary Refill: Capillary refill takes less than 2 seconds.  Neurological:     General: No focal deficit present.     Mental Status: She is alert and oriented to person, place, and time.  Psychiatric:        Mood and Affect: Mood normal.        Behavior: Behavior normal.        Thought Content: Thought content normal.        Judgment: Judgment normal.      UC Treatments / Results  Labs (all labs ordered are listed, but only abnormal results are displayed) Labs Reviewed  CBC WITH DIFFERENTIAL/PLATELET  COMPREHENSIVE METABOLIC PANEL  BRAIN NATRIURETIC PEPTIDE    EKG   Radiology No results found.  Procedures Procedures (including critical care time)  Medications Ordered in UC Medications - No data to display  Initial Impression / Assessment and Plan / UC Course  I have reviewed the triage vital signs and the nursing notes.  Pertinent labs & imaging results that were available during my care of the patient were reviewed by me and considered in my medical decision making (see chart for details).     This patient is a very pleasant 36 y.o. year old female presenting with plantar fasciitis and dependent edema. No recent trauma, but she does stand all day at work. Lungs are clear to auscultation without wheezes rhonchi or  rales, and she denies  CP or SOB. Will check a CMP and CBC; also checked a BNP as she has a family history of heart failure.  Low concern for DVT or PE as there is no unilateral edema, no venous distention, negative Homans' sign; she is not on OCP contraception, non-smoker, no recent travel or prolonged immobilization.  Continue compression socks and elevation. For plantar fascitis - low-dose prednisone sent. F/u with ortho, she has one already. She is also working on weight loss; praised healthy lifestyle changes. ED return precautions discussed. Patient verbalizes understanding and agreement.    Final Clinical Impressions(s) / UC Diagnoses   Final diagnoses:  Dependent edema  Plantar fasciitis     Discharge Instructions      -Prednisone, 2 pills taken at the same time for 5 days in a row.  Try taking this earlier in the day as it can give you energy. Avoid NSAIDs like ibuprofen and alleve while taking this medication as they can increase your risk of stomach upset and even GI bleeding when in combination with a steroid. You can continue tylenol (acetaminophen) up to 1000mg  3x daily. -Continue compression socks while standing at work  -Elevate legs at the end of the day -Follow-up with orthopedist -We'll call with any abnormal lab results    ED Prescriptions     Medication Sig Dispense Auth. Provider   predniSONE (DELTASONE) 20 MG tablet Take 2 tablets (40 mg total) by mouth daily for 5 days. Take with breakfast or lunch. Avoid NSAIDs (ibuprofen, etc) while taking this medication. 10 tablet Hazel Sams, PA-C      PDMP not reviewed this encounter.   Hazel Sams, PA-C 09/08/21 1843

## 2021-11-24 ENCOUNTER — Ambulatory Visit (INDEPENDENT_AMBULATORY_CARE_PROVIDER_SITE_OTHER): Payer: 59 | Admitting: Internal Medicine

## 2021-11-24 ENCOUNTER — Other Ambulatory Visit: Payer: 59

## 2021-11-24 ENCOUNTER — Encounter: Payer: Self-pay | Admitting: Internal Medicine

## 2021-11-24 VITALS — BP 160/102 | HR 100 | Ht 62.0 in | Wt 265.0 lb

## 2021-11-24 DIAGNOSIS — R112 Nausea with vomiting, unspecified: Secondary | ICD-10-CM | POA: Diagnosis not present

## 2021-11-24 DIAGNOSIS — R197 Diarrhea, unspecified: Secondary | ICD-10-CM

## 2021-11-24 DIAGNOSIS — R140 Abdominal distension (gaseous): Secondary | ICD-10-CM

## 2021-11-24 DIAGNOSIS — Z800 Family history of malignant neoplasm of digestive organs: Secondary | ICD-10-CM

## 2021-11-24 DIAGNOSIS — R109 Unspecified abdominal pain: Secondary | ICD-10-CM | POA: Diagnosis not present

## 2021-11-24 MED ORDER — NA SULFATE-K SULFATE-MG SULF 17.5-3.13-1.6 GM/177ML PO SOLN: 1.0000 | Freq: Once | ORAL | 0 refills | Status: AC

## 2021-11-24 MED ORDER — ONDANSETRON HCL 4 MG PO TABS: 4.0000 mg | ORAL_TABLET | Freq: Three times a day (TID) | ORAL | 2 refills | Status: DC | PRN

## 2021-11-24 NOTE — Patient Instructions (Addendum)
If you are age 37 or younger, your body mass index should be between 19-25. Your Body mass index is 48.47 kg/m. If this is out of the aformentioned range listed, please consider follow up with your Primary Care Provider.   __________________________________________________________  The [facility] providers would like to encourage you to use [facility] to communicate with providers for non-urgent requests or questions.  Due to long hold times on the telephone, sending your provider a message by [facility] may be a faster and more efficient way to get a response.  Please allow 48 business hours for a response.  Please remember that this is for non-urgent requests.   Due to recent changes in healthcare laws, you may see the results of your imaging and laboratory studies on MyChart before your provider has had a chance to review them.  We understand that in some cases there may be results that are confusing or concerning to you. Not all laboratory results come back in the same time frame and the provider may be waiting for multiple results in order to interpret others.  Please give [place] 48 hours in order for your provider to thoroughly review all the results before contacting the office for clarification of your results.   You have been scheduled for a colonoscopy. Please follow written instructions given to you at your visit today.  Please pick up your prep supplies at the pharmacy within the next 1-3 days. If you use inhalers (even only as needed), please bring them with you on the day of your procedure.   Your provider has requested that you go to the basement level for lab work before leaving today. Press "B" on the elevator. The lab is located at the first door on the left as you exit the elevator.  We have sent the following medications to your pharmacy for you to pick up at your convenience: Suprep, Zofran  Eat small meal through out the day time.  Eat low fat food and low fiber diet for  now.    Thank you for entrusting me with your care and for choosing [facility],  Dr. Eulah Pont

## 2021-11-24 NOTE — Progress Notes (Signed)
Chief Complaint: Nausea, bloating, family history of colon cancer  HPI : 36 year old female with family history of colon cancer presents with diarrhea and N&V  She got diagnosed with COVID-19 infection about 3 weeks ago. After she recovered from COVID, she realized that her BMs have changed. Her stools are now green to bright orange in color. She has had looser stools than usual. Endorses on average 6-7 BMs per day. Denies nocturnal stools. She does not feel like she adequately clears out her stools when she has  BM. She feels full really easily after eating. She has gained a lot of weight over the last few weeks. When she eats, she has to use the bathroom immediately.  Denies blood in the stools. Endorses N&V at times. She feels like she has no energy. Denies dysphagia. Has some abdominal discomfort but no pain. Denies marijuana use.Grandmother had colon cancer (diagnosed in her 51s), and mother had 12 polyps removed from the colon recently (when she was 36 years old).   History reviewed. No pertinent past medical history.  Past Surgical History:  Procedure Laterality Date   FRACTURE SURGERY     right leg fracture    TUBAL LIGATION     Family History  Problem Relation Age of Onset   Colon polyps Mother    Congestive Heart Failure Maternal Grandfather    Colon polyps Maternal Aunt    Colon polyps Maternal Aunt    Stomach cancer Neg Hx    Esophageal cancer Neg Hx    Social History   Tobacco Use   Smoking status: Every Day    Types: Cigarettes   Smokeless tobacco: Never   Tobacco comments:    2 cigs a day  Vaping Use   Vaping Use: Never used  Substance Use Topics   Alcohol use: Yes    Comment: socially   Drug use: Never   Current Outpatient Medications  Medication Sig Dispense Refill   acetaminophen (TYLENOL) 500 MG tablet Take 1 tablet (500 mg total) by mouth every 6 (six) hours as needed. (Patient not taking: Reported on 11/24/2021) 30 tablet 0   gabapentin (NEURONTIN) 100  MG capsule Take 100 mg by mouth daily. (Patient not taking: Reported on 11/24/2021)     ibuprofen (ADVIL) 800 MG tablet Take 1 tablet (800 mg total) by mouth 3 (three) times daily. (Patient not taking: Reported on 11/24/2021) 21 tablet 0   lidocaine (XYLOCAINE) 2 % solution Use as directed 15 mLs in the mouth or throat as needed for mouth pain. (Patient not taking: Reported on 11/24/2021) 150 mL 0   meloxicam (MOBIC) 7.5 MG tablet Take 7.5 mg by mouth daily. (Patient not taking: Reported on 11/24/2021)     traMADol (ULTRAM) 50 MG tablet Take 1 tablet (50 mg total) by mouth every 6 (six) hours as needed. (Patient not taking: Reported on 11/24/2021) 15 tablet 0   No current facility-administered medications for this visit.   No Known Allergies  Review of Systems: All systems reviewed and negative except where noted in HPI.   Physical Exam: BP (!) 160/102   Pulse 100   Ht 5\' 2"  (1.575 m)   Wt 265 lb (120.2 kg)   BMI 48.47 kg/m  Constitutional: Pleasant,well-developed, female in no acute distress. HEENT: Normocephalic and atraumatic. Conjunctivae are normal. No scleral icterus. Cardiovascular: Normal rate, regular rhythm.  Pulmonary/chest: Effort normal and breath sounds normal. No wheezing, rales or rhonchi. Abdominal: Soft, nondistended, nontender. Bowel sounds active throughout. There  are no masses palpable. No hepatomegaly. Extremities: No edema Neurological: Alert and oriented to person place and time. Skin: Skin is warm and dry. No rashes noted. Psychiatric: Normal mood and affect. Behavior is normal.  Labs 08/2021: CBC and CMP unremarkable  ASSESSMENT AND PLAN: Diarrhea Abdominal discomfort Bloating N&V Family history of colon cancer Patient presents with worsened diarrhea, early satiety, N&V, and bloating following a COVID-19 infection 3 weeks ago. 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 Will plan to  obtain infectious stool tests and obtain a colonoscopy for further evaluation. Her N&V, bloating, and early satiety could be related to gastroparesis, which can be associated with infections. Thus I will go ahead and encourage the patient to follow a gastroparesis diet and take Zofran PRN. Hopefully with time her symptoms will continue to improve. - Check GI profile stool test - Zofran 4 mg TID PRN - Eat small meals through the daytime. Follow a low fat low fiber diet. - Colonoscopy [facility]  Eulah Pont, MD

## 2021-12-23 ENCOUNTER — Encounter: Payer: 59 | Admitting: Internal Medicine

## 2022-02-24 ENCOUNTER — Other Ambulatory Visit: Payer: Self-pay

## 2022-02-24 ENCOUNTER — Telehealth: Payer: Self-pay

## 2022-02-24 MED ORDER — ONDANSETRON HCL 4 MG PO TABS: 4.0000 mg | ORAL_TABLET | Freq: Three times a day (TID) | ORAL | 1 refills | Status: DC | PRN

## 2022-02-24 MED ORDER — OMEPRAZOLE 40 MG PO CPDR: 40.0000 mg | DELAYED_RELEASE_CAPSULE | Freq: Every day | ORAL | 3 refills | Status: DC

## 2022-02-24 NOTE — Telephone Encounter (Signed)
Left patient a voicemail and sent her a message through My Chart.

## 2022-02-24 NOTE — Telephone Encounter (Signed)
Patient tells me she has been dealing with off and on nausea and epigastric burning. Today is worse than her usual. She has tried to eat soup but her nausea is worse than ususal. She has vomited x2 today but on;y brings up "bile." And has a lingering sour taste in her mouth. She is rubbing her abdomen when talking to me and reports it is uncomfortable.  Patient denies any recent NSAIDs and states she is not on any medications right now. Admits to significant stress outside of work. She has taken 2 antacid chewables and is hoping this will provide some relief. Can the patient have anything for nausea?   She will use [facility] at [address] in [place].

## 2022-07-30 ENCOUNTER — Encounter: Payer: Self-pay | Admitting: Internal Medicine

## 2022-07-30 ENCOUNTER — Other Ambulatory Visit: Payer: Self-pay

## 2022-07-30 MED ORDER — ONDANSETRON HCL 4 MG PO TABS: 4.0000 mg | ORAL_TABLET | Freq: Three times a day (TID) | ORAL | 1 refills | Status: DC | PRN

## 2022-08-18 ENCOUNTER — Encounter: Payer: Self-pay | Admitting: Internal Medicine

## 2022-08-18 ENCOUNTER — Ambulatory Visit (INDEPENDENT_AMBULATORY_CARE_PROVIDER_SITE_OTHER): Payer: 59 | Admitting: Internal Medicine

## 2022-08-18 VITALS — BP 136/88 | HR 76 | Ht 62.0 in | Wt 263.4 lb

## 2022-08-18 DIAGNOSIS — R112 Nausea with vomiting, unspecified: Secondary | ICD-10-CM

## 2022-08-18 DIAGNOSIS — R140 Abdominal distension (gaseous): Secondary | ICD-10-CM | POA: Diagnosis not present

## 2022-08-18 DIAGNOSIS — R198 Other specified symptoms and signs involving the digestive system and abdomen: Secondary | ICD-10-CM

## 2022-08-18 DIAGNOSIS — Z800 Family history of malignant neoplasm of digestive organs: Secondary | ICD-10-CM

## 2022-08-18 DIAGNOSIS — R109 Unspecified abdominal pain: Secondary | ICD-10-CM

## 2022-08-18 MED ORDER — DICYCLOMINE HCL 10 MG PO CAPS: 10.0000 mg | ORAL_CAPSULE | Freq: Three times a day (TID) | ORAL | 0 refills | Status: AC | PRN

## 2022-08-18 MED ORDER — ONDANSETRON HCL 4 MG PO TABS: 4.0000 mg | ORAL_TABLET | Freq: Three times a day (TID) | ORAL | 3 refills | Status: AC | PRN

## 2022-08-18 NOTE — Progress Notes (Signed)
Chief Complaint: Nausea, bloating  HPI : 37 year old female with family history of colon cancer presents for follow up of N&V  She got diagnosed with COVID-19 infection about 3 weeks ago. After she recovered from COVID, she realized that her BMs have changed. Her stools are now green to bright orange in color. She has had looser stools than usual. Endorses on average 6-7 BMs per day. Denies nocturnal stools. She does not feel like she adequately clears out her stools when she has  BM. She feels full really easily after eating. She has gained a lot of weight over the last few weeks. When she eats, she has to use the bathroom immediately.  Denies blood in the stools. Endorses N&V at times. She feels like she has no energy. Denies dysphagia. Has some abdominal discomfort but no pain. Denies marijuana use.Grandmother had colon cancer (diagnosed in her 39s), and mother had 12 polyps removed from the colon recently (when she was 38 years old).   Interval History: She feels like she has a lot of stress-induced GI symptoms, such as vomiting and bloating. She has a poor appetite but has been gaining weight despite not eating a lot. After eating, she tends to have to go immediately to the bathroom to have a BM. Stools can be dark brown or yellow. Denies hematochezia or melena. Her stools alternate between diarrhea and constipation. She is still having abdominal pain, mostly in the upper abdomen. She has been drinking more water and tries to eat more veggies. Her mother is going thorough a bone marrow transplant so she has been stressful on her.  Current Outpatient Medications  Medication Sig Dispense Refill   ondansetron (ZOFRAN) 4 MG tablet Take 1 tablet (4 mg total) by mouth every 8 (eight) hours as needed for nausea or vomiting. 30 tablet 1   Peppermint Oil (IBGARD) 90 MG CPCR Take 1 capsule by mouth as needed.     No current facility-administered medications for this visit.   Physical Exam: BP 136/88  (BP Location: Left Wrist, Patient Position: Sitting, Cuff Size: Normal)   Pulse 76   Ht 5\' 2"  (1.575 m)   Wt 263 lb 6 oz (119.5 kg)   LMP 08/15/2022   BMI 48.17 kg/m  Constitutional: Pleasant,well-developed, female in no acute distress. HEENT: Normocephalic and atraumatic. Conjunctivae are normal. No scleral icterus. Cardiovascular: Normal rate, regular rhythm.  Pulmonary/chest: Effort normal and breath sounds normal. No wheezing, rales or rhonchi. Abdominal: Soft, nondistended, tender in the lower abdomen. Bowel sounds active throughout. There are no masses palpable. No hepatomegaly. Extremities: No edema Neurological: Alert and oriented to person place and time. Skin: Skin is warm and dry. No rashes noted. Psychiatric: Normal mood and affect. Behavior is normal.  Labs 08/2021: CBC and CMP unremarkable  ASSESSMENT AND PLAN: Alternating constipation or diarrhea Abdominal discomfort Bloating N&V Family history of colon cancer Patient has still had issues with alternating constipation and diarrhea, abdominal discomfort (mostly in the lower abdomen), and N&V. Many of her symptoms seem stress related. Although she was previously scheduled for a colonoscopy, she does not have a caretaker to be able to take her for a colonoscopy procedure currently. Thus will try to treat her symptomatically for now. Will start her on a daily fiber supplement and Bentyl PRN. Will also have her try Nexium to see if uncontrolled GERD may be contributing to her N&V. - Low FODMAP diet - Start daily fiber supplement - Zofran 4 mg TID PRN. Refill -  Start Bentyl 10 mg TID PRN - Start Nexium 40 mg QD - Patient declines a colonoscopy at this time because her mom is her primary care partner and she does not have another care partner available at this time - RTC in 3 months  Eulah Pont, MD  I spent 31 minutes of time, including in depth chart review, independent review of results as outlined above, communicating  results with the patient directly, face-to-face time with the patient, coordinating care, ordering studies and medications as appropriate, and documentation.

## 2022-08-18 NOTE — Patient Instructions (Addendum)
We have sent the following medications to your pharmacy for you to pick up at your convenience: Bentyl  Start daily fiber supplement   You are schedule for follow up visit on 12/02/22 at 3:20 pm   If your blood pressure at your visit was 140/90 or greater, please contact your primary care physician to follow up on this.  _______________________________________________________  If you are age 38 or older, your body mass index should be between 23-30. Your Body mass index is 48.17 kg/m. If this is out of the aforementioned range listed, please consider follow up with your Primary Care Provider.  If you are age 77 or younger, your body mass index should be between 19-25. Your Body mass index is 48.17 kg/m. If this is out of the aformentioned range listed, please consider follow up with your Primary Care Provider.   ________________________________________________________  The [place] [facility] providers would like to encourage you to use [facility] to communicate with providers for non-urgent requests or questions.  Due to long hold times on the telephone, sending your provider a message by [facility] may be a faster and more efficient way to get a response.  Please allow 48 business hours for a response.  Please remember that this is for non-urgent requests.  _______________________________________________________    Thank you for entrusting me with your care and for choosing [facility], Dr. Eulah Pont

## 2022-11-03 DIAGNOSIS — A7489 Other chlamydial diseases: Secondary | ICD-10-CM | POA: Diagnosis not present

## 2022-11-03 DIAGNOSIS — Z113 Encounter for screening for infections with a predominantly sexual mode of transmission: Secondary | ICD-10-CM | POA: Diagnosis not present

## 2022-12-02 ENCOUNTER — Telehealth: Payer: 59 | Admitting: Physician Assistant

## 2022-12-02 ENCOUNTER — Ambulatory Visit: Payer: 59 | Admitting: Internal Medicine

## 2022-12-02 DIAGNOSIS — R6889 Other general symptoms and signs: Secondary | ICD-10-CM | POA: Diagnosis not present

## 2022-12-02 MED ORDER — OSELTAMIVIR PHOSPHATE 75 MG PO CAPS: 75.0000 mg | ORAL_CAPSULE | Freq: Two times a day (BID) | ORAL | 0 refills | Status: AC

## 2022-12-02 NOTE — Progress Notes (Signed)
E visit for Flu like symptoms   We are sorry that you are not feeling well.  Here is how we plan to help! Based on what you have shared with me it looks like you may have a respiratory virus that may be influenza.  Influenza or "the flu" is   an infection caused by a respiratory virus. The flu virus is highly contagious and persons who did not receive their yearly flu vaccination may "catch" the flu from close contact.  We have anti-viral medications to treat the viruses that cause this infection. They are not a "cure" and only shorten the course of the infection. These prescriptions are most effective when they are given within the first 2 days of "flu" symptoms. Antiviral medication are indicated if you have a high risk of complications from the flu. You should  also consider an antiviral medication if you are in close contact with someone who is at risk. These medications can help patients avoid complications from the flu  but have side effects that you should know. Possible side effects from Tamiflu or oseltamivir include nausea, vomiting, diarrhea, dizziness, headaches, eye redness, sleep problems or other respiratory symptoms. You should not take Tamiflu if you have an allergy to oseltamivir or any to the ingredients in Tamiflu.  Based upon your symptoms and potential risk factors I have prescribed Oseltamivir (Tamiflu).  It has been sent to your designated pharmacy.  You will take one 75 mg capsule orally twice a day for the next 5 days. and I recommend that you follow the flu symptoms recommendation that I have listed below.  Please keep well-hydrated and try to get plenty of rest. If you have a humidifier, place it in the bedroom and run it at night. Start a saline nasal rinse for nasal congestion. You can consider use of a nasal steroid spray like Flonase or Nasacort OTC. You can alternate between Tylenol and Ibuprofen if needed for fever, body aches, headache and/or throat pain. Salt  water-gargles and chloraseptic spray can be very beneficial for sore throat. Mucinex-DM for congestion or cough. Please take all prescribed medications as directed.  Remain out of work until fever-free for 24 hours without a fever-reducing medication, and you are feeling better.  You should mask until symptoms are resolved.  If anything worsens despite treatment, you need to be evaluated in-person. Please do not delay care.   ANYONE WHO HAS FLU SYMPTOMS SHOULD: Stay home. The flu is highly contagious and going out or to work exposes others! Be sure to drink plenty of fluids. Water is fine as well as fruit juices, sodas and electrolyte beverages. You may want to stay away from caffeine or alcohol. If you are nauseated, try taking small sips of liquids. How do you know if you are getting enough fluid? Your urine should be a pale yellow or almost colorless. Get rest. Taking a steamy shower or using a humidifier may help nasal congestion and ease sore throat pain. Using a saline nasal spray works much the same way. Cough drops, hard candies and sore throat lozenges may ease your cough. Line up a caregiver. Have someone check on you regularly.   GET HELP RIGHT AWAY IF: You cannot keep down liquids or your medications. You become short of breath Your fell like you are going to pass out or loose consciousness. Your symptoms persist after you have completed your treatment plan MAKE SURE YOU  Understand these instructions. Will watch your condition. Will get help right   away if you are not doing well or get worse.  Your e-visit answers were reviewed by a board certified advanced clinical practitioner to complete your personal care plan.  Depending on the condition, your plan could have included both over the counter or prescription medications.  If there is a problem please reply  once you have received a response from your provider.  Your safety is important to us.  If you have drug allergies  check your prescription carefully.    You can use MyChart to ask questions about today's visit, request a non-urgent call back, or ask for a work or school excuse for 24 hours related to this e-Visit. If it has been greater than 24 hours you will need to follow up with your provider, or enter a new e-Visit to address those concerns.  You will get an e-mail in the next two days asking about your experience.  I hope that your e-visit has been valuable and will speed your recovery. Thank you for using e-visits.  

## 2022-12-02 NOTE — Progress Notes (Signed)
Message sent to patient requesting further input regarding current symptoms. Awaiting patient response.  

## 2022-12-02 NOTE — Progress Notes (Signed)
I have spent 5 minutes in review of e-visit questionnaire, review and updating patient chart, medical decision making and response to patient.   William Cody Martin, PA-C    

## 2023-01-17 ENCOUNTER — Encounter: Payer: 59 | Admitting: Obstetrics and Gynecology

## 2023-03-16 ENCOUNTER — Telehealth: Payer: 59

## 2023-03-16 ENCOUNTER — Other Ambulatory Visit: Payer: Self-pay | Admitting: Medical Genetics

## 2023-03-16 ENCOUNTER — Other Ambulatory Visit (INDEPENDENT_AMBULATORY_CARE_PROVIDER_SITE_OTHER): Payer: 59

## 2023-03-16 ENCOUNTER — Other Ambulatory Visit: Payer: Self-pay

## 2023-03-16 DIAGNOSIS — R140 Abdominal distension (gaseous): Secondary | ICD-10-CM

## 2023-03-16 DIAGNOSIS — R112 Nausea with vomiting, unspecified: Secondary | ICD-10-CM

## 2023-03-16 DIAGNOSIS — R198 Other specified symptoms and signs involving the digestive system and abdomen: Secondary | ICD-10-CM

## 2023-03-16 DIAGNOSIS — R109 Unspecified abdominal pain: Secondary | ICD-10-CM

## 2023-03-16 LAB — TSH: TSH: 0.78 u[IU]/mL (ref 0.35–5.50)

## 2023-03-16 LAB — HIGH SENSITIVITY CRP: CRP, High Sensitivity: 11.17 mg/L — ABNORMAL HIGH (ref 0.000–5.000)

## 2023-03-16 LAB — COMPREHENSIVE METABOLIC PANEL
ALT: 10 U/L (ref 0–35)
AST: 13 U/L (ref 0–37)
Albumin: 4 g/dL (ref 3.5–5.2)
Alkaline Phosphatase: 55 U/L (ref 39–117)
BUN: 13 mg/dL (ref 6–23)
CO2: 28 meq/L (ref 19–32)
Calcium: 9 mg/dL (ref 8.4–10.5)
Chloride: 100 meq/L (ref 96–112)
Creatinine, Ser: 0.63 mg/dL (ref 0.40–1.20)
GFR: 112.49 mL/min (ref >60.00)
Glucose, Bld: 86 mg/dL (ref 70–99)
Potassium: 3.9 meq/L (ref 3.5–5.1)
Sodium: 133 meq/L — ABNORMAL LOW (ref 135–145)
Total Bilirubin: 0.3 mg/dL (ref 0.2–1.2)
Total Protein: 7.6 g/dL (ref 6.0–8.3)

## 2023-03-16 LAB — CBC WITH DIFFERENTIAL/PLATELET
Basophils Absolute: 0.1 10*3/uL (ref 0.0–0.1)
Basophils Relative: 0.7 % (ref 0.0–3.0)
Eosinophils Absolute: 0.2 10*3/uL (ref 0.0–0.7)
Eosinophils Relative: 2.3 % (ref 0.0–5.0)
HCT: 39.3 % (ref 36.0–46.0)
Hemoglobin: 13 g/dL (ref 12.0–15.0)
Lymphocytes Relative: 36.7 % (ref 12.0–46.0)
Lymphs Abs: 3.4 10*3/uL (ref 0.7–4.0)
MCHC: 33.1 g/dL (ref 30.0–36.0)
MCV: 98.2 fL (ref 78.0–100.0)
Monocytes Absolute: 1 10*3/uL (ref 0.1–1.0)
Monocytes Relative: 10.8 % (ref 3.0–12.0)
Neutro Abs: 4.6 10*3/uL (ref 1.4–7.7)
Neutrophils Relative %: 49.5 % (ref 43.0–77.0)
Platelets: 321 10*3/uL (ref 150.0–400.0)
RBC: 4 Mil/uL (ref 3.87–5.11)
RDW: 12.8 % (ref 11.5–15.5)
WBC: 9.3 10*3/uL (ref 4.0–10.5)

## 2023-03-16 NOTE — Telephone Encounter (Signed)
Orders entered. Patient advised.

## 2023-03-16 NOTE — Telephone Encounter (Addendum)
Patient is reaching out to you about some concerns. She complains of feeling very full, heavy, and tired. Complaining of fatigue. Frequent, loose bowel movements. Must empty her bowels very soon after eating. Reports she is taking her medications. "Its been like this maybe 2 weeks. my family and I had like a stomach bug and ever since then it has been this way.  it happens about 4-6 times a day and it always seems as if I'm not voiding myself completely."  Asks if she should have labs?

## 2023-03-16 NOTE — Telephone Encounter (Signed)
Danielle Burn, MD  You1 hour ago (11:09 AM)    Danielle Osborn, let's have her do CBC, CMP, TTG IgA, IgA, CRP, TSH. Let's also do a [facility] GI pathogen panel with C dif please.

## 2023-03-17 LAB — TISSUE TRANSGLUTAMINASE ABS,IGG,IGA
(tTG) Ab, IgA: <1 U/mL
(tTG) Ab, IgG: <1 U/mL

## 2023-03-17 LAB — IGA: Immunoglobulin A: 460 mg/dL — ABNORMAL HIGH (ref 47–310)
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
## 2023-03-22 ENCOUNTER — Encounter: Payer: Self-pay | Admitting: Internal Medicine

## 2023-03-22 NOTE — Progress Notes (Signed)
Received report from [facility] about GI pathogen panel (collected 03/18/23). GI pathogen panel was negative for all infections. Will have this report scanned into the EMR.  Tisha, please let the patient know that her stool test was negative for infection. She may still have had an infection that was not detected by this test.

## 2023-04-04 ENCOUNTER — Encounter: Payer: Self-pay | Admitting: Internal Medicine

## 2023-04-15 ENCOUNTER — Other Ambulatory Visit (HOSPITAL_COMMUNITY): Payer: Self-pay | Attending: Medical Genetics

## 2023-06-15 ENCOUNTER — Telehealth: Payer: Self-pay | Admitting: Family Medicine

## 2023-06-15 DIAGNOSIS — J069 Acute upper respiratory infection, unspecified: Secondary | ICD-10-CM

## 2023-06-15 MED ORDER — BENZONATATE 100 MG PO CAPS: 100.0000 mg | ORAL_CAPSULE | Freq: Three times a day (TID) | ORAL | 0 refills | Status: AC | PRN

## 2023-06-15 MED ORDER — IPRATROPIUM BROMIDE 0.03 % NA SOLN: 2.0000 | Freq: Two times a day (BID) | NASAL | 0 refills | Status: AC

## 2023-06-15 NOTE — Progress Notes (Signed)

## 2023-06-16 IMAGING — CR DG CHEST 2V
2 series · 2 of 2 positions shown · non-contrast
Comparison: None Available.

CLINICAL DATA: Shortness of breath.

EXAM:
CHEST - 2 VIEW

[w chest pa]
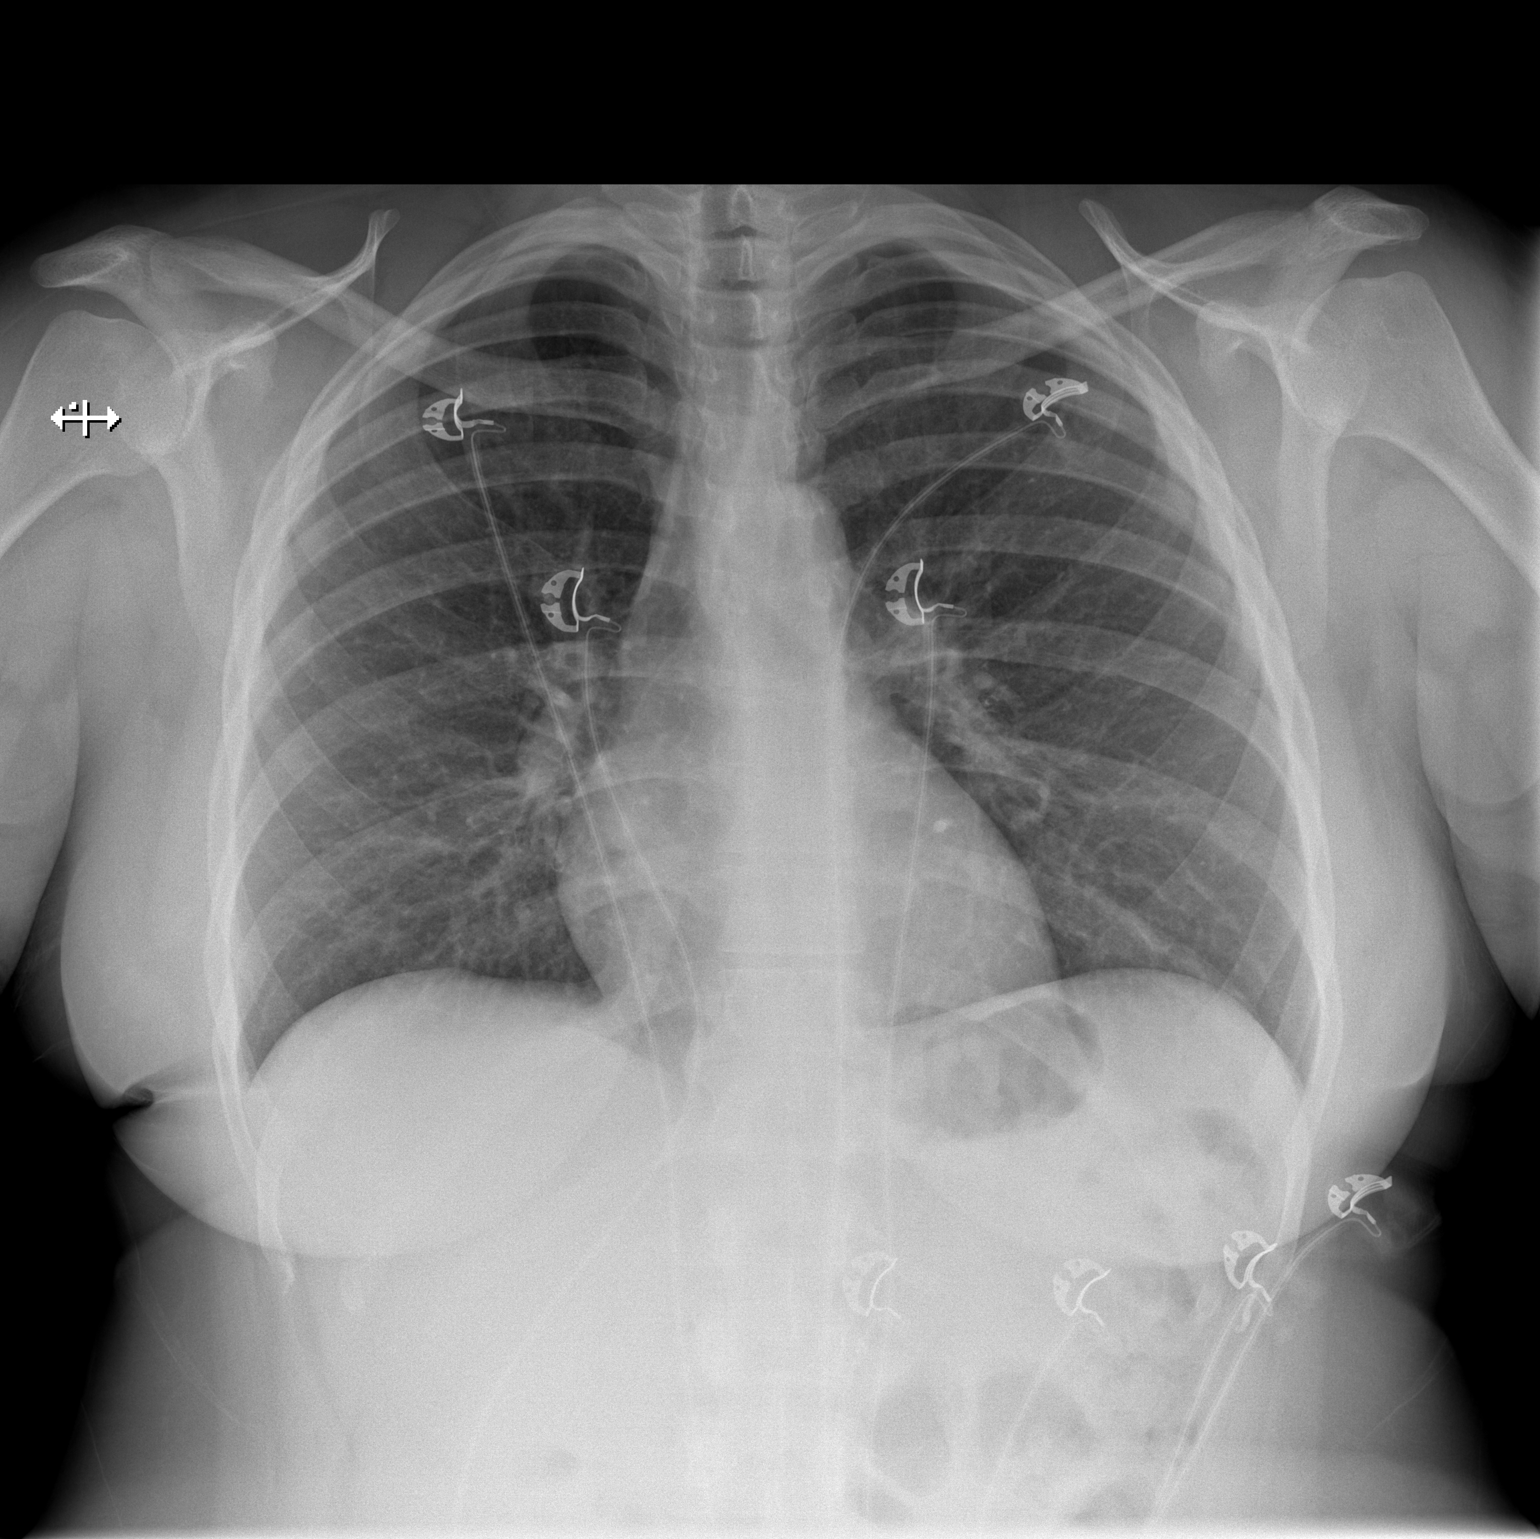

[w chest lat]
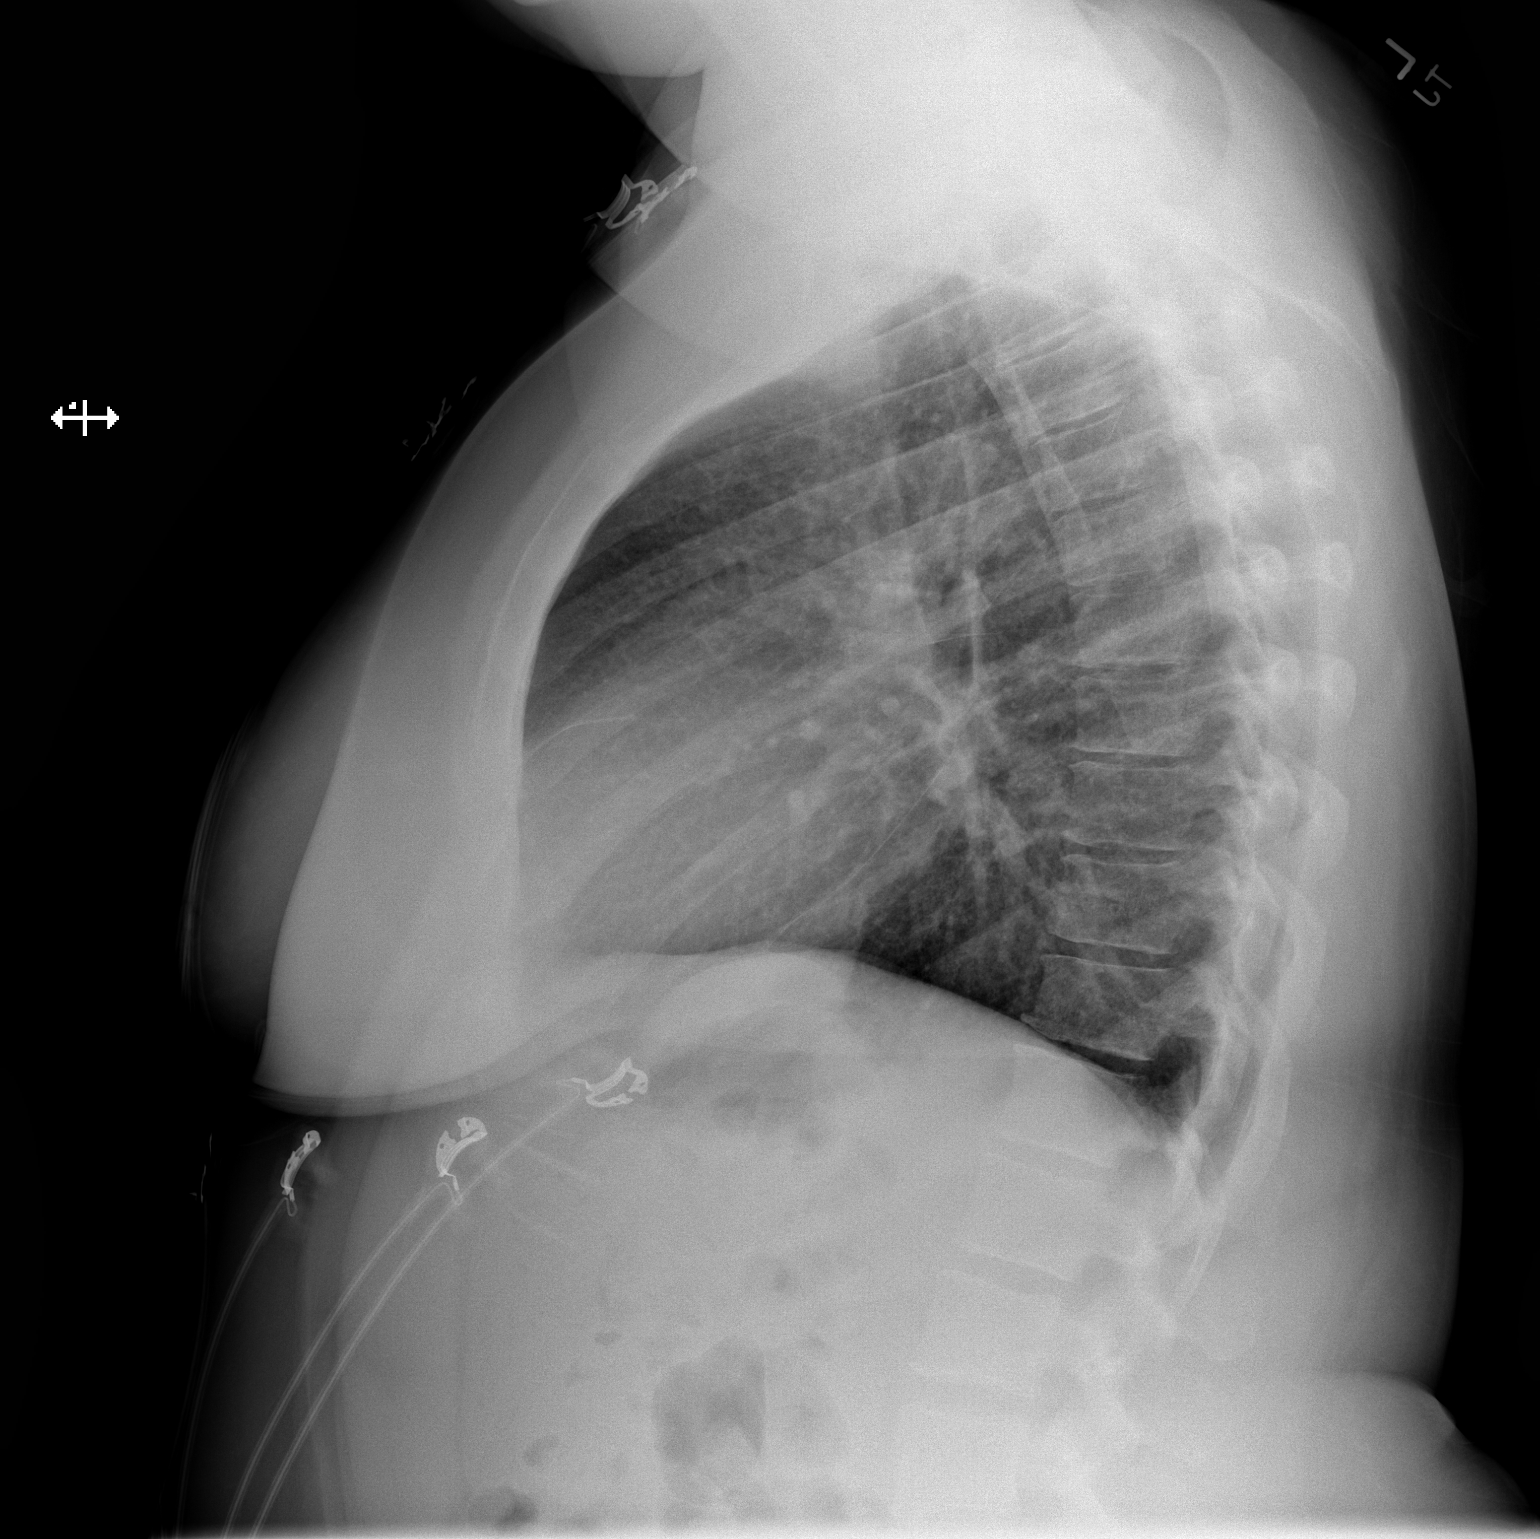

[2 of 2 positions shown; findings below may reference images not displayed]

FINDINGS: The cardiac silhouette, mediastinal and hilar contours are normal.
The lungs are clear. No pleural effusions. No pulmonary lesions. The
bony thorax is intact.
IMPRESSION: No acute cardiopulmonary findings.

## 2023-06-17 ENCOUNTER — Telehealth: Payer: Self-pay | Admitting: Family Medicine

## 2023-06-17 DIAGNOSIS — J111 Influenza due to unidentified influenza virus with other respiratory manifestations: Secondary | ICD-10-CM

## 2023-06-17 MED ORDER — PROMETHAZINE-DM 6.25-15 MG/5ML PO SYRP: 5.0000 mL | ORAL_SOLUTION | Freq: Four times a day (QID) | ORAL | 0 refills | Status: AC | PRN

## 2023-06-17 NOTE — Progress Notes (Signed)
 E visit for Flu like symptoms   We are sorry that you are not feeling well.  Here is how we plan to help! Based on what you have shared with me it looks like you may have a respiratory virus that may be influenza.  Influenza or "the flu" is   an infection caused by a respiratory virus. The flu virus is highly contagious and persons who did not receive their yearly flu vaccination may "catch" the flu from close contact.  We have anti-viral medications to treat the viruses that cause this infection. They are not a "cure" and only shorten the course of the infection. These prescriptions are most effective when they are given within the first 2 days of "flu" symptoms. Antiviral medication are indicated if you have a high risk of complications from the flu. You should  also consider an antiviral medication if you are in close contact with someone who is at risk. These medications can help patients avoid complications from the flu  but have side effects that you should know. Possible side effects from Tamiflu or oseltamivir include nausea, vomiting, diarrhea, dizziness, headaches, eye redness, sleep problems or other respiratory symptoms. You should not take Tamiflu if you have an allergy to oseltamivir or any to the ingredients in Tamiflu.  Unfortunately, Tamiflu has to be started within 4 hrs of symptom onset. I will send you cough medications.   Based upon your symptoms and potential risk factors I recommend that you follow the flu symptoms recommendation that I have listed below.  ANYONE WHO HAS FLU SYMPTOMS SHOULD: Stay home. The flu is highly contagious and going out or to work exposes others! Be sure to drink plenty of fluids. Water is fine as well as fruit juices, sodas and electrolyte beverages. You may want to stay away from caffeine or alcohol. If you are nauseated, try taking small sips of liquids. How do you know if you are getting enough fluid? Your urine should be a pale yellow or almost  colorless. Get rest. Taking a steamy shower or using a humidifier may help nasal congestion and ease sore throat pain. Using a saline nasal spray works much the same way. Cough drops, hard candies and sore throat lozenges may ease your cough. Line up a caregiver. Have someone check on you regularly.   GET HELP RIGHT AWAY IF: You cannot keep down liquids or your medications. You become short of breath Your fell like you are going to pass out or loose consciousness. Your symptoms persist after you have completed your treatment plan MAKE SURE YOU  Understand these instructions. Will watch your condition. Will get help right away if you are not doing well or get worse.  Your e-visit answers were reviewed by a board certified advanced clinical practitioner to complete your personal care plan.  Depending on the condition, your plan could have included both over the counter or prescription medications.  If there is a problem please reply  once you have received a response from your provider.  Your safety is important to [place].  If you have drug allergies check your prescription carefully.    You can use MyChart to ask questions about today's visit, request a non-urgent call back, or ask for a work or school excuse for 24 hours related to this e-Visit. If it has been greater than 24 hours you will need to follow up with your provider, or enter a new e-Visit to address those concerns.  You will get an e-mail  in the next two days asking about your experience.  I hope that your e-visit has been valuable and will speed your recovery. Thank you for using e-visits.    have provided 5 minutes of non face to face time during this encounter for chart review and documentation.

## 2023-06-24 ENCOUNTER — Encounter: Payer: 59 | Admitting: Internal Medicine

## 2024-01-08 ENCOUNTER — Other Ambulatory Visit: Payer: Self-pay | Admitting: Medical Genetics

## 2024-01-08 DIAGNOSIS — Z006 Encounter for examination for normal comparison and control in clinical research program: Secondary | ICD-10-CM
